# Patient Record
Sex: Female | Born: 1935 | Race: White | Hispanic: No | Marital: Married | Smoking: Former smoker
Health system: Southern US, Community
[De-identification: ages and names within clinical notes are randomized; demographics above are authoritative.]

## PROBLEM LIST (undated history)

## (undated) DIAGNOSIS — I499 Cardiac arrhythmia, unspecified: Secondary | ICD-10-CM

## (undated) DIAGNOSIS — G5602 Carpal tunnel syndrome, left upper limb: Secondary | ICD-10-CM

## (undated) DIAGNOSIS — I4891 Unspecified atrial fibrillation: Secondary | ICD-10-CM

## (undated) DIAGNOSIS — M1990 Unspecified osteoarthritis, unspecified site: Secondary | ICD-10-CM

## (undated) DIAGNOSIS — M810 Age-related osteoporosis without current pathological fracture: Secondary | ICD-10-CM

## (undated) HISTORY — PX: OTHER SURGICAL HISTORY: SHX169

## (undated) HISTORY — DX: Carpal tunnel syndrome, left upper limb: G56.02

## (undated) HISTORY — DX: Age-related osteoporosis without current pathological fracture: M81.0

## (undated) HISTORY — DX: Cardiac arrhythmia, unspecified: I49.9

## (undated) HISTORY — DX: Unspecified atrial fibrillation: I48.91

## (undated) HISTORY — PX: APPENDECTOMY: SHX54

## (undated) HISTORY — PX: MASTECTOMY: SHX3

---

## 1978-10-31 DIAGNOSIS — R87619 Unspecified abnormal cytological findings in specimens from cervix uteri: Secondary | ICD-10-CM | POA: Insufficient documentation

## 1995-11-01 HISTORY — PX: LEEP: SHX91

## 2000-12-21 ENCOUNTER — Other Ambulatory Visit: Admission: RE | Admit: 2000-12-21 | Discharge: 2000-12-21 | Payer: Self-pay | Admitting: Obstetrics and Gynecology

## 2001-03-13 ENCOUNTER — Encounter: Payer: Self-pay | Admitting: Obstetrics and Gynecology

## 2001-03-13 ENCOUNTER — Ambulatory Visit (HOSPITAL_COMMUNITY): Admission: RE | Admit: 2001-03-13 | Discharge: 2001-03-13 | Payer: Self-pay | Admitting: Obstetrics and Gynecology

## 2002-04-23 ENCOUNTER — Ambulatory Visit (HOSPITAL_COMMUNITY): Admission: RE | Admit: 2002-04-23 | Discharge: 2002-04-23 | Payer: Self-pay | Admitting: Obstetrics and Gynecology

## 2002-04-23 ENCOUNTER — Encounter: Payer: Self-pay | Admitting: Obstetrics and Gynecology

## 2003-02-28 ENCOUNTER — Other Ambulatory Visit: Admission: RE | Admit: 2003-02-28 | Discharge: 2003-02-28 | Payer: Self-pay | Admitting: Obstetrics and Gynecology

## 2004-02-01 ENCOUNTER — Other Ambulatory Visit: Payer: Self-pay

## 2004-02-02 ENCOUNTER — Other Ambulatory Visit: Payer: Self-pay

## 2004-04-26 ENCOUNTER — Ambulatory Visit (HOSPITAL_COMMUNITY): Admission: RE | Admit: 2004-04-26 | Discharge: 2004-04-26 | Payer: Self-pay | Admitting: Family Medicine

## 2004-04-26 ENCOUNTER — Other Ambulatory Visit: Admission: RE | Admit: 2004-04-26 | Discharge: 2004-04-26 | Payer: Self-pay | Admitting: Obstetrics and Gynecology

## 2005-12-02 ENCOUNTER — Ambulatory Visit: Payer: Self-pay | Admitting: General Surgery

## 2005-12-02 LAB — HM COLONOSCOPY

## 2006-01-05 ENCOUNTER — Inpatient Hospital Stay: Payer: Self-pay | Admitting: Surgery

## 2006-01-05 ENCOUNTER — Other Ambulatory Visit: Payer: Self-pay

## 2006-04-17 ENCOUNTER — Other Ambulatory Visit: Admission: RE | Admit: 2006-04-17 | Discharge: 2006-04-17 | Payer: Self-pay | Admitting: Obstetrics and Gynecology

## 2006-04-17 ENCOUNTER — Ambulatory Visit (HOSPITAL_COMMUNITY): Admission: RE | Admit: 2006-04-17 | Discharge: 2006-04-17 | Payer: Self-pay | Admitting: Family Medicine

## 2006-05-01 ENCOUNTER — Encounter: Admission: RE | Admit: 2006-05-01 | Discharge: 2006-05-01 | Payer: Self-pay | Admitting: Family Medicine

## 2007-06-06 ENCOUNTER — Encounter: Admission: RE | Admit: 2007-06-06 | Discharge: 2007-06-06 | Payer: Self-pay | Admitting: Family Medicine

## 2008-07-15 ENCOUNTER — Encounter: Admission: RE | Admit: 2008-07-15 | Discharge: 2008-07-15 | Payer: Self-pay | Admitting: Family Medicine

## 2010-10-31 DIAGNOSIS — I4891 Unspecified atrial fibrillation: Secondary | ICD-10-CM

## 2010-10-31 HISTORY — DX: Unspecified atrial fibrillation: I48.91

## 2010-12-27 ENCOUNTER — Other Ambulatory Visit: Payer: Self-pay | Admitting: *Deleted

## 2010-12-27 DIAGNOSIS — Z9010 Acquired absence of unspecified breast and nipple: Secondary | ICD-10-CM

## 2011-01-04 ENCOUNTER — Ambulatory Visit
Admission: RE | Admit: 2011-01-04 | Discharge: 2011-01-04 | Disposition: A | Discharge status: Home / Self Care | Payer: Medicare Other | Source: Ambulatory Visit | Attending: *Deleted | Admitting: *Deleted

## 2011-01-04 DIAGNOSIS — Z9010 Acquired absence of unspecified breast and nipple: Secondary | ICD-10-CM

## 2011-01-14 ENCOUNTER — Other Ambulatory Visit: Payer: Self-pay | Admitting: Family Medicine

## 2011-01-16 ENCOUNTER — Inpatient Hospital Stay: Payer: Self-pay | Admitting: Internal Medicine

## 2011-02-23 ENCOUNTER — Ambulatory Visit: Payer: Self-pay | Admitting: Internal Medicine

## 2011-03-29 ENCOUNTER — Ambulatory Visit: Payer: Self-pay | Admitting: Internal Medicine

## 2011-10-17 ENCOUNTER — Ambulatory Visit: Payer: Self-pay | Admitting: Internal Medicine

## 2011-11-08 DIAGNOSIS — J189 Pneumonia, unspecified organism: Secondary | ICD-10-CM | POA: Diagnosis not present

## 2011-11-08 DIAGNOSIS — R0602 Shortness of breath: Secondary | ICD-10-CM | POA: Diagnosis not present

## 2012-01-20 ENCOUNTER — Other Ambulatory Visit: Payer: Self-pay | Admitting: Family Medicine

## 2012-01-20 DIAGNOSIS — Z9011 Acquired absence of right breast and nipple: Secondary | ICD-10-CM

## 2012-01-20 DIAGNOSIS — Z1231 Encounter for screening mammogram for malignant neoplasm of breast: Secondary | ICD-10-CM

## 2012-02-23 DIAGNOSIS — E7800 Pure hypercholesterolemia, unspecified: Secondary | ICD-10-CM | POA: Diagnosis not present

## 2012-02-23 DIAGNOSIS — R509 Fever, unspecified: Secondary | ICD-10-CM | POA: Diagnosis not present

## 2012-02-23 DIAGNOSIS — C50919 Malignant neoplasm of unspecified site of unspecified female breast: Secondary | ICD-10-CM | POA: Diagnosis not present

## 2012-02-23 DIAGNOSIS — Z1331 Encounter for screening for depression: Secondary | ICD-10-CM | POA: Diagnosis not present

## 2012-02-23 DIAGNOSIS — Z1339 Encounter for screening examination for other mental health and behavioral disorders: Secondary | ICD-10-CM | POA: Diagnosis not present

## 2012-02-23 DIAGNOSIS — R079 Chest pain, unspecified: Secondary | ICD-10-CM | POA: Diagnosis not present

## 2012-02-23 DIAGNOSIS — Z0000 Encounter for general adult medical examination without abnormal findings: Secondary | ICD-10-CM | POA: Diagnosis not present

## 2012-02-23 LAB — LIPID PANEL
Cholesterol: 233 mg/dL — AB (ref 0–200)
HDL: 128 mg/dL — AB (ref 35–70)
LDL Cholesterol: 91 mg/dL
LDl/HDL Ratio: 0.7
Triglycerides: 71 mg/dL (ref 40–160)

## 2012-03-08 ENCOUNTER — Ambulatory Visit
Admission: RE | Admit: 2012-03-08 | Discharge: 2012-03-08 | Disposition: A | Discharge status: Home / Self Care | Payer: Medicare Other | Source: Ambulatory Visit | Attending: Family Medicine | Admitting: Family Medicine

## 2012-03-08 DIAGNOSIS — Z1231 Encounter for screening mammogram for malignant neoplasm of breast: Secondary | ICD-10-CM

## 2012-03-08 DIAGNOSIS — Z9011 Acquired absence of right breast and nipple: Secondary | ICD-10-CM

## 2012-03-13 ENCOUNTER — Other Ambulatory Visit: Payer: Self-pay | Admitting: Family Medicine

## 2012-03-13 DIAGNOSIS — R928 Other abnormal and inconclusive findings on diagnostic imaging of breast: Secondary | ICD-10-CM

## 2012-03-14 DIAGNOSIS — H2510 Age-related nuclear cataract, unspecified eye: Secondary | ICD-10-CM | POA: Diagnosis not present

## 2012-03-22 ENCOUNTER — Other Ambulatory Visit: Payer: Self-pay | Admitting: Family Medicine

## 2012-03-22 ENCOUNTER — Ambulatory Visit
Admission: RE | Admit: 2012-03-22 | Discharge: 2012-03-22 | Disposition: A | Discharge status: Home / Self Care | Payer: Medicare Other | Source: Ambulatory Visit | Attending: Family Medicine | Admitting: Family Medicine

## 2012-03-22 DIAGNOSIS — R928 Other abnormal and inconclusive findings on diagnostic imaging of breast: Secondary | ICD-10-CM

## 2012-12-06 DIAGNOSIS — R197 Diarrhea, unspecified: Secondary | ICD-10-CM | POA: Diagnosis not present

## 2012-12-06 DIAGNOSIS — R198 Other specified symptoms and signs involving the digestive system and abdomen: Secondary | ICD-10-CM | POA: Diagnosis not present

## 2014-02-17 DIAGNOSIS — H2510 Age-related nuclear cataract, unspecified eye: Secondary | ICD-10-CM | POA: Diagnosis not present

## 2014-03-19 DIAGNOSIS — C50919 Malignant neoplasm of unspecified site of unspecified female breast: Secondary | ICD-10-CM | POA: Diagnosis not present

## 2014-03-19 DIAGNOSIS — Z1331 Encounter for screening for depression: Secondary | ICD-10-CM | POA: Diagnosis not present

## 2014-03-19 DIAGNOSIS — Z0000 Encounter for general adult medical examination without abnormal findings: Secondary | ICD-10-CM | POA: Diagnosis not present

## 2014-03-19 DIAGNOSIS — Z1339 Encounter for screening examination for other mental health and behavioral disorders: Secondary | ICD-10-CM | POA: Diagnosis not present

## 2014-03-19 DIAGNOSIS — Z23 Encounter for immunization: Secondary | ICD-10-CM | POA: Diagnosis not present

## 2014-04-04 ENCOUNTER — Ambulatory Visit: Payer: Self-pay | Admitting: Family Medicine

## 2014-04-04 DIAGNOSIS — M5020 Other cervical disc displacement, unspecified cervical region: Secondary | ICD-10-CM | POA: Diagnosis not present

## 2014-04-04 DIAGNOSIS — M5030 Other cervical disc degeneration, unspecified cervical region: Secondary | ICD-10-CM | POA: Diagnosis not present

## 2014-04-04 DIAGNOSIS — M47812 Spondylosis without myelopathy or radiculopathy, cervical region: Secondary | ICD-10-CM | POA: Diagnosis not present

## 2014-04-04 DIAGNOSIS — M542 Cervicalgia: Secondary | ICD-10-CM | POA: Diagnosis not present

## 2014-04-22 DIAGNOSIS — M810 Age-related osteoporosis without current pathological fracture: Secondary | ICD-10-CM | POA: Diagnosis not present

## 2014-06-05 ENCOUNTER — Encounter: Payer: Self-pay | Admitting: *Deleted

## 2014-06-05 DIAGNOSIS — I499 Cardiac arrhythmia, unspecified: Secondary | ICD-10-CM | POA: Insufficient documentation

## 2014-06-05 DIAGNOSIS — I4891 Unspecified atrial fibrillation: Secondary | ICD-10-CM

## 2014-08-14 ENCOUNTER — Other Ambulatory Visit: Payer: Self-pay

## 2014-08-14 DIAGNOSIS — Z1231 Encounter for screening mammogram for malignant neoplasm of breast: Secondary | ICD-10-CM

## 2014-08-14 DIAGNOSIS — Z853 Personal history of malignant neoplasm of breast: Secondary | ICD-10-CM

## 2014-08-14 DIAGNOSIS — Z9011 Acquired absence of right breast and nipple: Secondary | ICD-10-CM

## 2014-08-20 DIAGNOSIS — C50919 Malignant neoplasm of unspecified site of unspecified female breast: Secondary | ICD-10-CM | POA: Diagnosis not present

## 2014-08-20 DIAGNOSIS — M1990 Unspecified osteoarthritis, unspecified site: Secondary | ICD-10-CM | POA: Diagnosis not present

## 2014-08-20 DIAGNOSIS — M810 Age-related osteoporosis without current pathological fracture: Secondary | ICD-10-CM | POA: Diagnosis not present

## 2014-08-20 LAB — TSH: TSH: 4.79 u[IU]/mL (ref 0.41–5.90)

## 2014-08-20 LAB — BASIC METABOLIC PANEL
BUN: 16 mg/dL (ref 4–21)
Creatinine: 0.8 mg/dL (ref 0.5–1.1)
Glucose: 95 mg/dL
Potassium: 4.7 mmol/L (ref 3.4–5.3)
Sodium: 140 mmol/L (ref 137–147)

## 2014-08-20 LAB — CBC AND DIFFERENTIAL
HEMATOCRIT: 45 % (ref 36–46)
HEMOGLOBIN: 15.2 g/dL (ref 12.0–16.0)
Neutrophils Absolute: 3 /uL
PLATELETS: 272 10*3/uL (ref 150–399)
WBC: 5 10*3/mL

## 2014-08-20 LAB — HEPATIC FUNCTION PANEL
ALK PHOS: 98 U/L (ref 25–125)
ALT: 17 U/L (ref 7–35)
AST: 22 U/L (ref 13–35)
BILIRUBIN, TOTAL: 0.5 mg/dL

## 2014-08-29 ENCOUNTER — Ambulatory Visit
Admission: RE | Admit: 2014-08-29 | Discharge: 2014-08-29 | Disposition: A | Discharge status: Home / Self Care | Payer: Medicare Other | Source: Ambulatory Visit

## 2014-08-29 DIAGNOSIS — R636 Underweight: Secondary | ICD-10-CM | POA: Diagnosis not present

## 2014-08-29 DIAGNOSIS — Z9011 Acquired absence of right breast and nipple: Secondary | ICD-10-CM

## 2014-08-29 DIAGNOSIS — Z853 Personal history of malignant neoplasm of breast: Secondary | ICD-10-CM

## 2014-08-29 DIAGNOSIS — Z01419 Encounter for gynecological examination (general) (routine) without abnormal findings: Secondary | ICD-10-CM | POA: Diagnosis not present

## 2014-08-29 DIAGNOSIS — Z1231 Encounter for screening mammogram for malignant neoplasm of breast: Secondary | ICD-10-CM

## 2014-08-29 DIAGNOSIS — Z13820 Encounter for screening for osteoporosis: Secondary | ICD-10-CM | POA: Diagnosis not present

## 2014-12-19 DIAGNOSIS — M8580 Other specified disorders of bone density and structure, unspecified site: Secondary | ICD-10-CM | POA: Diagnosis not present

## 2015-03-31 DIAGNOSIS — E559 Vitamin D deficiency, unspecified: Secondary | ICD-10-CM | POA: Diagnosis not present

## 2015-07-28 ENCOUNTER — Other Ambulatory Visit: Payer: Self-pay

## 2015-07-28 DIAGNOSIS — Z9011 Acquired absence of right breast and nipple: Secondary | ICD-10-CM

## 2015-07-28 DIAGNOSIS — Z1231 Encounter for screening mammogram for malignant neoplasm of breast: Secondary | ICD-10-CM

## 2015-08-20 DIAGNOSIS — E559 Vitamin D deficiency, unspecified: Secondary | ICD-10-CM | POA: Diagnosis not present

## 2015-09-07 ENCOUNTER — Ambulatory Visit
Admission: RE | Admit: 2015-09-07 | Discharge: 2015-09-07 | Disposition: A | Discharge status: Home / Self Care | Payer: Medicare Other | Source: Ambulatory Visit

## 2015-09-07 DIAGNOSIS — Z1231 Encounter for screening mammogram for malignant neoplasm of breast: Secondary | ICD-10-CM

## 2015-09-07 DIAGNOSIS — Z9011 Acquired absence of right breast and nipple: Secondary | ICD-10-CM

## 2015-11-09 DIAGNOSIS — S82832A Other fracture of upper and lower end of left fibula, initial encounter for closed fracture: Secondary | ICD-10-CM | POA: Diagnosis not present

## 2015-11-30 DIAGNOSIS — S82832D Other fracture of upper and lower end of left fibula, subsequent encounter for closed fracture with routine healing: Secondary | ICD-10-CM | POA: Diagnosis not present

## 2015-12-28 DIAGNOSIS — M25572 Pain in left ankle and joints of left foot: Secondary | ICD-10-CM | POA: Diagnosis not present

## 2015-12-31 ENCOUNTER — Encounter: Payer: Self-pay | Admitting: Family Medicine

## 2015-12-31 ENCOUNTER — Ambulatory Visit (INDEPENDENT_AMBULATORY_CARE_PROVIDER_SITE_OTHER): Payer: Medicare Other | Admitting: Family Medicine

## 2015-12-31 VITALS — BP 132/60 | HR 60 | Temp 98.5°F | Resp 10 | Ht 65.75 in | Wt 116.0 lb

## 2015-12-31 DIAGNOSIS — Z0000 Encounter for general adult medical examination without abnormal findings: Secondary | ICD-10-CM | POA: Diagnosis not present

## 2015-12-31 DIAGNOSIS — I499 Cardiac arrhythmia, unspecified: Secondary | ICD-10-CM | POA: Insufficient documentation

## 2015-12-31 DIAGNOSIS — R5383 Other fatigue: Secondary | ICD-10-CM

## 2015-12-31 DIAGNOSIS — Z1211 Encounter for screening for malignant neoplasm of colon: Secondary | ICD-10-CM

## 2015-12-31 DIAGNOSIS — M810 Age-related osteoporosis without current pathological fracture: Secondary | ICD-10-CM | POA: Insufficient documentation

## 2015-12-31 DIAGNOSIS — M159 Polyosteoarthritis, unspecified: Secondary | ICD-10-CM | POA: Diagnosis not present

## 2015-12-31 DIAGNOSIS — I8390 Asymptomatic varicose veins of unspecified lower extremity: Secondary | ICD-10-CM | POA: Insufficient documentation

## 2015-12-31 DIAGNOSIS — R000 Tachycardia, unspecified: Secondary | ICD-10-CM | POA: Insufficient documentation

## 2015-12-31 DIAGNOSIS — M1990 Unspecified osteoarthritis, unspecified site: Secondary | ICD-10-CM | POA: Insufficient documentation

## 2015-12-31 DIAGNOSIS — M8580 Other specified disorders of bone density and structure, unspecified site: Secondary | ICD-10-CM | POA: Insufficient documentation

## 2015-12-31 NOTE — Progress Notes (Signed)
Patient ID: Alisha Dean, female   DOB: [birth year], 82 y.o.   MRN: [id number]  Visit Date: 12/31/2015  Today's Provider: [name], MD   Chief Complaint  Patient presents with  . Medicare Wellness   Subjective:   Alisha Dean is a 82 y.o. female who presents today for her Subsequent Annual Wellness Visit. She feels well. She reports exercising goes to the gym for 1 hour twice a week and works outside. She reports she is sleeping fairly well.  Last Pneumovax 01/19/11  Prevnar 03/19/14  Tdap 04/27/11  Mammogram 09/07/15 BMD 04/22/14 osteopenia Colonoscopy 12/02/05 diverticulosis-repeat 8 to 10 years. Pap smear at Dr. Raphael Gibney in [place] 2016 per patient.  Review of Systems  Constitutional: Negative.   HENT: Negative.   Eyes: Negative.   Respiratory: Negative.   Cardiovascular: Negative.   Gastrointestinal: Negative.   Endocrine: Negative.   Genitourinary: Negative.   Musculoskeletal: Negative.   Skin: Negative.   Allergic/Immunologic: Negative.   Neurological: Negative.   Hematological: Negative.   Psychiatric/Behavioral: Negative.     Patient Active Problem List   Diagnosis Date Noted  . Arrhythmia     Social History   Social History  . Marital Status: Married    Spouse Name: N/A  . Number of Children: N/A  . Years of Education: N/A   Occupational History  . Not on file.   Social History Main Topics  . Smoking status: Former Smoker -- 0.25 packs/day for 11 years    Types: Cigarettes    Quit date: 06/06/1979  . Smokeless tobacco: Never Used  . Alcohol Use: Yes     Comment: 2 to 4 times a month 1 to 2 drinks at those times.  . Drug Use: No  . Sexual Activity: Not on file   Other Topics Concern  . Not on file   Social History Narrative    Past Surgical History  Procedure Laterality Date  . Right masectomy    . Appendectomy      Her family history includes Coronary artery disease in her father; Crohn's disease in her sister;  Dementia in her brother and mother; Diabetes Mellitus I in her father.    Outpatient Prescriptions Prior to Visit  Medication Sig Dispense Refill  . Calcium Carbonate-Vitamin D (CALCIUM + D PO) Take 600 mg by mouth.    Marland Kitchen ibuprofen (ADVIL,MOTRIN) 200 MG tablet Take 200 mg by mouth every 6 (six) hours as needed.    Marland Kitchen albuterol-ipratropium (COMBIVENT) 18-103 MCG/ACT inhaler Inhale into the lungs every 4 (four) hours.    Marland Kitchen alendronate (FOSAMAX) 70 MG tablet Take 70 mg by mouth once a week. Take with a full glass of water on an empty stomach.    . diltiazem (DILACOR XR) 180 MG 24 hr capsule Take 180 mg by mouth daily.    . Glucosamine-Chondroit-Vit C-Mn (GLUCOSAMINE 1500 COMPLEX PO) Take by mouth.    . Melatonin 3 MG CAPS Take by mouth.    . Probiotic Product (PROBIOTIC DAILY PO) Take by mouth.     No facility-administered medications prior to visit.    No Known Allergies  Patient Care Team: Jerrol Banana., MD as PCP - General (Family Medicine)  Objective:   Vitals:  Filed Vitals:   12/31/15 0910  BP: 132/60  Pulse: 60  Temp: 98.5 F (36.9 C)  Resp: 10  Height: 5' 5.75" (1.67 m)  Weight: 116 lb (52.617 kg)    Physical Exam  Constitutional: She is oriented  to person, place, and time. She appears well-developed and well-nourished.  Cachectic white female in no acute distress  HENT:  Head: Normocephalic and atraumatic.  Right Ear: External ear normal.  Left Ear: External ear normal.  Nose: Nose normal.  Mouth/Throat: Oropharynx is clear and moist.  Eyes: Conjunctivae and EOM are normal. Pupils are equal, round, and reactive to light.  Neck: Neck supple.  Cardiovascular: Normal rate, regular rhythm, normal heart sounds and intact distal pulses.   Pulmonary/Chest: Effort normal and breath sounds normal.  Abdominal: Soft.  Neurological: She is alert and oriented to person, place, and time.  Skin: Skin is warm and dry.  Psychiatric: She has a normal mood and affect. Her  behavior is normal. Judgment and thought content normal.    Activities of Daily Living In your present state of health, do you have any difficulty performing the following activities: 12/31/2015  Hearing? N  Vision? N  Difficulty concentrating or making decisions? N  Walking or climbing stairs? N  Dressing or bathing? N  Doing errands, shopping? N    Fall Risk Assessment Fall Risk  12/31/2015  Falls in the past year? Yes  Number falls in past yr: 1  Injury with Fall? Yes  Follow up Falls evaluation completed     Depression Screen  PHQ 2/9 Scores 12/31/2015  PHQ - 2 Score 0    Cognitive Testing - 6-CIT    Year: 0 4 points  Month: 0 3 points  Memorize "Pia Mau, [address], [place]"  Time (within 1 hour:) 0 3 points  Count backwards from 20: 0 2 4 points  Name months of year: 0 2 4 points  Repeat Address: 0 2 4 6 8 10  points   Total Score: 0/28  Interpretation : Normal (0-7) Abnormal (8-28)    Assessment & Plan:     Annual Wellness Visit  Reviewed patient's Family Medical History Reviewed and updated list of patient's medical providers Assessment of cognitive impairment was done Assessed patient's functional ability Established a written schedule for health screening [place] Completed and Reviewed   Pelvic exam and rectal exam through APP at Dr. Frann Rider  Office.  - EKG 12-Lead  2. Colon cancer screening  - CBC with Differential/Platelet - Comprehensive metabolic panel  4. Osteoarthritis of multiple joints, unspecified osteoarthritis type  5. Cardiac arrhythmia, unspecified cardiac arrhythmia type - TSH - EKG 12-Lead  6. Other fatigue - TSH 7. Mild caloric  malnutrition  I have done the exam and reviewed the above chart and it is accurate to the best of my knowledge.  Miguel Aschoff MD [facility] 12/31/2015 9:13  AM  ------------------------------------------------------------------------------------------------------------

## 2016-01-04 DIAGNOSIS — I4891 Unspecified atrial fibrillation: Secondary | ICD-10-CM | POA: Diagnosis not present

## 2016-01-04 DIAGNOSIS — R5383 Other fatigue: Secondary | ICD-10-CM | POA: Diagnosis not present

## 2016-01-04 DIAGNOSIS — I499 Cardiac arrhythmia, unspecified: Secondary | ICD-10-CM | POA: Diagnosis not present

## 2016-01-05 LAB — TSH: TSH: 5.97 u[IU]/mL — AB (ref 0.450–4.500)

## 2016-01-07 ENCOUNTER — Encounter: Payer: Self-pay | Admitting: Family Medicine

## 2016-08-15 ENCOUNTER — Other Ambulatory Visit: Payer: Self-pay | Admitting: Family Medicine

## 2016-08-15 DIAGNOSIS — Z1231 Encounter for screening mammogram for malignant neoplasm of breast: Secondary | ICD-10-CM

## 2016-09-14 ENCOUNTER — Ambulatory Visit
Admission: RE | Admit: 2016-09-14 | Discharge: 2016-09-14 | Disposition: A | Discharge status: Home / Self Care | Payer: Medicare Other | Source: Ambulatory Visit | Attending: Family Medicine | Admitting: Family Medicine

## 2016-09-14 DIAGNOSIS — Z1231 Encounter for screening mammogram for malignant neoplasm of breast: Secondary | ICD-10-CM | POA: Diagnosis not present

## 2017-01-26 ENCOUNTER — Encounter: Payer: Medicare Other | Admitting: Family Medicine

## 2017-01-26 ENCOUNTER — Ambulatory Visit: Payer: Medicare Other

## 2017-02-14 ENCOUNTER — Ambulatory Visit (INDEPENDENT_AMBULATORY_CARE_PROVIDER_SITE_OTHER): Payer: Medicare Other | Admitting: Family Medicine

## 2017-02-14 ENCOUNTER — Encounter: Payer: Self-pay | Admitting: Family Medicine

## 2017-02-14 VITALS — BP 122/64 | HR 62 | Temp 97.5°F | Resp 16 | Ht 66.0 in | Wt 116.0 lb

## 2017-02-14 DIAGNOSIS — E784 Other hyperlipidemia: Secondary | ICD-10-CM

## 2017-02-14 DIAGNOSIS — Z1211 Encounter for screening for malignant neoplasm of colon: Secondary | ICD-10-CM | POA: Diagnosis not present

## 2017-02-14 DIAGNOSIS — Z0000 Encounter for general adult medical examination without abnormal findings: Secondary | ICD-10-CM | POA: Diagnosis not present

## 2017-02-14 DIAGNOSIS — M159 Polyosteoarthritis, unspecified: Secondary | ICD-10-CM | POA: Diagnosis not present

## 2017-02-14 DIAGNOSIS — R5383 Other fatigue: Secondary | ICD-10-CM | POA: Diagnosis not present

## 2017-02-14 DIAGNOSIS — E7849 Other hyperlipidemia: Secondary | ICD-10-CM

## 2017-02-14 NOTE — Progress Notes (Signed)
Patient: Alisha Dean, Female    DOB: February 24, 1933, 83 y.o.   MRN: [id number] Visit Date: 02/14/2017  Today's Provider: [name], MD   Chief Complaint  Patient presents with  . Annual Exam   Subjective:   Alisha Dean is a 83 y.o. female who presents today for her Subsequent Annual Wellness Visit. She feels well. She reports exercising at least 2 days a week, then yard work. She reports she is sleeping well. Pt has 61  Grandchildren ages 81 to 28 yo.  Colonoscopy- 12/02/05 diverticulosis repeat 8-10 years Mammogram- 09/14/16 normal BMD- 04/22/14 osteopenia  Pap- GYN in [place] saw about a year ago.   Mammogram, BMD ordered by GYN  Immunization History  Administered Date(s) Administered  . Influenza-Unspecified 09/01/2015  . Pneumococcal Conjugate-13 03/19/2014  . Pneumococcal Polysaccharide-23 01/19/2011  . Tdap 04/27/2011     Review of Systems  Constitutional: Negative.   HENT: Negative.   Eyes: Positive for itching.  Respiratory: Negative.   Cardiovascular: Negative.   Gastrointestinal: Negative.   Endocrine: Negative.   Genitourinary: Negative.   Musculoskeletal: Negative.   Skin: Negative.   Allergic/Immunologic: Negative.   Neurological: Negative.   Hematological: Negative.   Psychiatric/Behavioral: Negative.     Patient Active Problem List   Diagnosis Date Noted  . Arthritis, degenerative 12/31/2015  . OP (osteoporosis) 12/31/2015  . Fast heart beat 12/31/2015  . Asymptomatic varicose veins 12/31/2015  . Arrhythmia     Social History   Social History  . Marital status: Married    Spouse name: N/A  . Number of children: N/A  . Years of education: N/A   Occupational History  . Not on file.   Social History Main Topics  . Smoking status: Former Smoker    Packs/day: 0.25    Years: 11.00    Types: Cigarettes    Quit date: 06/06/1979  . Smokeless tobacco: Never Used  . Alcohol use Yes     Comment: 2 to 4 times a month 1 to  2 drinks at those times.  . Drug use: No  . Sexual activity: Not on file   Other Topics Concern  . Not on file   Social History Narrative  . No narrative on file    Past Surgical History:  Procedure Laterality Date  . APPENDECTOMY    . LEEP  1997  . right masectomy      Her family history includes Bipolar disorder in her brother; Coronary artery disease in her father; Crohn's disease in her sister; Dementia in her brother and mother; Depression in her mother; Diabetes Mellitus I in her father; Heart disease in her brother; Migraines in her mother; Prostate cancer in her father.     Outpatient Medications Prior to Visit  Medication Sig Dispense Refill  . Calcium Carbonate-Vitamin D (CALCIUM + D PO) Take 600 mg by mouth.    Marland Kitchen ibuprofen (ADVIL,MOTRIN) 200 MG tablet Take 200 mg by mouth every 6 (six) hours as needed.     No facility-administered medications prior to visit.     No Known Allergies  Patient Care Team: Jerrol Banana., MD as PCP - General (Family Medicine)   Objective:   Vitals:  Vitals:   02/14/17 0919  BP: 122/64  Pulse: 62  Resp: 16  Temp: 97.5 F (36.4 C)  TempSrc: Oral  Weight: 116 lb (52.6 kg)  Height: 5\' 6"  (1.676 m)    Physical Exam  Constitutional: She is oriented to person, place, and  time. She appears well-developed and well-nourished.  HENT:  Head: Normocephalic and atraumatic.  Right Ear: External ear normal.  Left Ear: External ear normal.  Nose: Nose normal.  Mouth/Throat: Oropharynx is clear and moist.  Eyes: Conjunctivae and EOM are normal. Pupils are equal, round, and reactive to light.  Neck: Normal range of motion. Neck supple.  Cardiovascular: Normal rate, regular rhythm, normal heart sounds and intact distal pulses.   Pulmonary/Chest: Effort normal and breath sounds normal.  Abdominal: Soft. Bowel sounds are normal.  Musculoskeletal: Normal range of motion.  Neurological: She is alert and oriented to person, place, and  time. She has normal reflexes.  Skin: Skin is warm and dry.  Psychiatric: She has a normal mood and affect. Her behavior is normal. Judgment and thought content normal.    Activities of Daily Living In your present state of health, do you have any difficulty performing the following activities: 02/14/2017  Hearing? N  Vision? N  Difficulty concentrating or making decisions? N  Walking or climbing stairs? N  Dressing or bathing? N  Doing errands, shopping? N  Some recent data might be hidden    Fall Risk Assessment Fall Risk  02/14/2017 12/31/2015  Falls in the past year? No Yes  Number falls in past yr: - 1  Injury with Fall? - Yes  Follow up - Falls evaluation completed     Depression Screen PHQ 2/9 Scores 02/14/2017 12/31/2015  PHQ - 2 Score 0 0  PHQ- 9 Score 1 -    Cognitive Testing - 6-CIT    Year: 0 points  Month: 0 points  Memorize "Pia Mau, [address], [place]"  Time (within 1 hour:) 0 points  Count backwards from 20: 0 points  Name months of year: 0 points  Repeat Address: 0 points   Total Score: 0/28  Interpretation : Normal (0-7) Abnormal (8-28)    Assessment & Plan:     Annual Wellness Visit  Reviewed patient's Family Medical History Reviewed and updated list of patient's medical providers Assessment of cognitive impairment was done Assessed patient's functional ability Established a written schedule for health screening [place] Completed and Reviewed  Exercise Activities and Dietary recommendations Goals    None      Immunization History  Administered Date(s) Administered  . Influenza-Unspecified 09/01/2015  . Pneumococcal Conjugate-13 03/19/2014  . Pneumococcal Polysaccharide-23 01/19/2011  . Tdap 04/27/2011    Health Maintenance  Topic Date Due  . DEXA SCAN  01/19/2000  . INFLUENZA VACCINE  05/31/2017  . TETANUS/TDAP  04/26/2021  . PNA vac Low Risk Adult  Completed   Sees Gyn in [place]. Cologard for  screening. Discussed health benefits of physical activity, and encouraged her to engage in regular exercise appropriate for her age and condition.  h/o Breast Cancer-s/p right mastectomy 1990 OA of Hands Ostopenia I have done the exam and reviewed the above chart and it is accurate to the best of my knowledge. Development worker, community has been used in this note in any air is in the dictation or transcription are unintentional.  [facility] 02/14/2017 9:23 AM  ------------------------------------------------------------------------------------------------------------

## 2017-02-15 LAB — LIPID PANEL WITH LDL/HDL RATIO
CHOLESTEROL TOTAL: 250 mg/dL — AB (ref 100–199)
HDL: 134 mg/dL (ref >39)
LDL Calculated: 102 mg/dL — ABNORMAL HIGH (ref 0–99)
LDl/HDL Ratio: 0.8 ratio (ref 0.0–3.2)
Triglycerides: 72 mg/dL (ref 0–149)
VLDL CHOLESTEROL CAL: 14 mg/dL (ref 5–40)

## 2017-02-15 LAB — CBC WITH DIFFERENTIAL/PLATELET
BASOS ABS: 0 10*3/uL (ref 0.0–0.2)
BASOS: 0 %
EOS (ABSOLUTE): 0.1 10*3/uL (ref 0.0–0.4)
Eos: 2 %
Hematocrit: 43.2 % (ref 34.0–46.6)
Hemoglobin: 14.6 g/dL (ref 11.1–15.9)
IMMATURE GRANS (ABS): 0 10*3/uL (ref 0.0–0.1)
IMMATURE GRANULOCYTES: 0 %
LYMPHS: 30 %
Lymphocytes Absolute: 1.8 10*3/uL (ref 0.7–3.1)
MCH: 31.7 pg (ref 26.6–33.0)
MCHC: 33.8 g/dL (ref 31.5–35.7)
MCV: 94 fL (ref 79–97)
MONOCYTES: 8 %
Monocytes Absolute: 0.5 10*3/uL (ref 0.1–0.9)
NEUTROS PCT: 60 %
Neutrophils Absolute: 3.5 10*3/uL (ref 1.4–7.0)
PLATELETS: 267 10*3/uL (ref 150–379)
RBC: 4.6 x10E6/uL (ref 3.77–5.28)
RDW: 13.9 % (ref 12.3–15.4)
WBC: 5.9 10*3/uL (ref 3.4–10.8)

## 2017-02-15 LAB — COMPREHENSIVE METABOLIC PANEL
A/G RATIO: 2.3 — AB (ref 1.2–2.2)
ALBUMIN: 4.8 g/dL — AB (ref 3.5–4.7)
ALT: 24 IU/L (ref 0–32)
AST: 30 IU/L (ref 0–40)
Alkaline Phosphatase: 88 IU/L (ref 39–117)
BILIRUBIN TOTAL: 0.4 mg/dL (ref 0.0–1.2)
BUN / CREAT RATIO: 29 — AB (ref 12–28)
BUN: 20 mg/dL (ref 8–27)
CO2: 28 mmol/L (ref 18–29)
Calcium: 10.7 mg/dL — ABNORMAL HIGH (ref 8.7–10.3)
Chloride: 96 mmol/L (ref 96–106)
Creatinine, Ser: 0.7 mg/dL (ref 0.57–1.00)
GFR calc non Af Amer: 81 mL/min/{1.73_m2} (ref >59)
GFR, EST AFRICAN AMERICAN: 93 mL/min/{1.73_m2} (ref >59)
Globulin, Total: 2.1 g/dL (ref 1.5–4.5)
Glucose: 94 mg/dL (ref 65–99)
POTASSIUM: 4.8 mmol/L (ref 3.5–5.2)
Sodium: 140 mmol/L (ref 134–144)
TOTAL PROTEIN: 6.9 g/dL (ref 6.0–8.5)

## 2017-02-15 LAB — TSH: TSH: 5.22 u[IU]/mL — ABNORMAL HIGH (ref 0.450–4.500)

## 2017-02-15 NOTE — Progress Notes (Signed)
Advised  ED 

## 2017-02-23 ENCOUNTER — Telehealth: Payer: Self-pay | Admitting: Family Medicine

## 2017-02-23 NOTE — Telephone Encounter (Signed)
Order for cologuard faxed to [facility] °

## 2017-03-15 DIAGNOSIS — Z1212 Encounter for screening for malignant neoplasm of rectum: Secondary | ICD-10-CM | POA: Diagnosis not present

## 2017-03-15 DIAGNOSIS — Z1211 Encounter for screening for malignant neoplasm of colon: Secondary | ICD-10-CM | POA: Diagnosis not present

## 2017-03-16 LAB — COLOGUARD: Cologuard: NEGATIVE

## 2017-03-28 ENCOUNTER — Telehealth: Payer: Self-pay

## 2017-03-28 NOTE — Telephone Encounter (Signed)
Cologuard result negative, LMTCB-aa

## 2017-05-08 ENCOUNTER — Telehealth: Payer: Self-pay | Admitting: Family Medicine

## 2017-05-08 NOTE — Telephone Encounter (Signed)
Lab slip placed up front, pt advised-aa

## 2017-05-08 NOTE — Telephone Encounter (Signed)
Pt is requesting a lb slip to recheck her calcium level. Pt would like to pick this up tomorrow if possible.   CB#[phone]/MW

## 2017-05-10 LAB — PTH, INTACT AND CALCIUM
Calcium: 9.6 mg/dL (ref 8.7–10.3)
PTH: 37 pg/mL (ref 15–65)

## 2017-05-10 LAB — CALCIUM, IONIZED: CALCIUM ION: 5.2 mg/dL (ref 4.5–5.6)

## 2017-12-13 ENCOUNTER — Other Ambulatory Visit: Payer: Self-pay | Admitting: Family Medicine

## 2017-12-13 DIAGNOSIS — Z1231 Encounter for screening mammogram for malignant neoplasm of breast: Secondary | ICD-10-CM

## 2017-12-25 DIAGNOSIS — E559 Vitamin D deficiency, unspecified: Secondary | ICD-10-CM | POA: Insufficient documentation

## 2017-12-25 DIAGNOSIS — I4891 Unspecified atrial fibrillation: Secondary | ICD-10-CM | POA: Diagnosis not present

## 2017-12-25 DIAGNOSIS — C50919 Malignant neoplasm of unspecified site of unspecified female breast: Secondary | ICD-10-CM | POA: Insufficient documentation

## 2017-12-29 ENCOUNTER — Ambulatory Visit
Admission: RE | Admit: 2017-12-29 | Discharge: 2017-12-29 | Disposition: A | Discharge status: Home / Self Care | Payer: Medicare Other | Source: Ambulatory Visit | Attending: Cardiology | Admitting: Cardiology

## 2017-12-29 ENCOUNTER — Encounter: Payer: Self-pay | Admitting: Cardiology

## 2017-12-29 ENCOUNTER — Encounter
Admission: RE | Disposition: A | Discharge status: Home / Self Care | Payer: Self-pay | Source: Ambulatory Visit | Attending: Cardiology

## 2017-12-29 DIAGNOSIS — I4891 Unspecified atrial fibrillation: Secondary | ICD-10-CM | POA: Diagnosis not present

## 2017-12-29 DIAGNOSIS — M1990 Unspecified osteoarthritis, unspecified site: Secondary | ICD-10-CM | POA: Insufficient documentation

## 2017-12-29 DIAGNOSIS — Z8249 Family history of ischemic heart disease and other diseases of the circulatory system: Secondary | ICD-10-CM | POA: Diagnosis not present

## 2017-12-29 DIAGNOSIS — Z7982 Long term (current) use of aspirin: Secondary | ICD-10-CM | POA: Insufficient documentation

## 2017-12-29 HISTORY — PX: LOOP RECORDER INSERTION: EP1214

## 2017-12-29 SURGERY — LOOP RECORDER INSERTION
Anesthesia: Moderate Sedation

## 2017-12-29 MED ORDER — LIDOCAINE-EPINEPHRINE (PF) 1 %-1:200000 IJ SOLN
INTRAMUSCULAR | Status: DC | PRN
  Administered 2017-12-29: 15 mL via INTRADERMAL

## 2017-12-29 MED ORDER — LIDOCAINE-EPINEPHRINE (PF) 1 %-1:200000 IJ SOLN
INTRAMUSCULAR | Status: AC
  Filled 2017-12-29: qty 30

## 2017-12-29 SURGICAL SUPPLY — 2 items
LOOP REVEAL LINQSYS (Prosthesis & Implant Heart) ×2 IMPLANT
PACK LOOP INSERTION (CUSTOM PROCEDURE TRAY) ×2 IMPLANT

## 2017-12-29 NOTE — Discharge Instructions (Signed)
Incision Care, Adult An incision is a surgical cut that is made through your skin. Most incisions are closed after surgery. Your incision may be closed with stitches (sutures), staples, skin glue, or adhesive strips. You may need to return to your health care provider to have sutures or staples removed. This may occur several days to several weeks after your surgery. The incision needs to be cared for properly to prevent infection. How to care for your incision Incision care   Follow instructions from your health care provider about how to take care of your incision. Make sure you: ? Wash your hands with soap and water before you change the bandage (dressing). If soap and water are not available, use hand sanitizer. ? Change your dressing as told by your health care provider. ? Leave sutures, skin glue, or adhesive strips in place. These skin closures may need to stay in place for 2 weeks or longer. If adhesive strip edges start to loosen and curl up, you may trim the loose edges. Do not remove adhesive strips completely unless your health care provider tells you to do that.  Check your incision area every day for signs of infection. Check for: ? More redness, swelling, or pain. ? More fluid or blood. ? Warmth. ? Pus or a bad smell.  Ask your health care provider how to clean the incision. This may include: ? Using mild soap and water. ? Using a clean towel to pat the incision dry after cleaning it. ? Applying a cream or ointment. Do this only as told by your health care provider. ? Covering the incision with a clean dressing.  Ask your health care provider when you can leave the incision uncovered.  Do not take baths, swim, or use a hot tub until your health care provider approves. Ask your health care provider if you can take showers. You may only be allowed to take sponge baths for bathing. Medicines  If you were prescribed an antibiotic medicine, cream, or ointment, take or apply the  antibiotic as told by your health care provider. Do not stop taking or applying the antibiotic even if your condition improves.  Take over-the-counter and prescription medicines only as told by your health care provider. General instructions  Limit movement around your incision to improve healing. ? Avoid straining, lifting, or exercise for the first month, or for as long as told by your health care provider. ? Follow instructions from your health care provider about returning to your normal activities. ? Ask your health care provider what activities are safe.  Protect your incision from the sun when you are outside for the first 6 months, or for as long as told by your health care provider. Apply sunscreen around the scar or cover it up.  Keep all follow-up visits as told by your health care provider. This is important. Contact a health care provider if:  Your have more redness, swelling, or pain around the incision.  You have more fluid or blood coming from the incision.  Your incision feels warm to the touch.  You have pus or a bad smell coming from the incision.  You have a fever or shaking chills.  You are nauseous or you vomit.  You are dizzy.  Your sutures or staples come undone. Get help right away if:  You have a red streak coming from your incision.  Your incision bleeds through the dressing and the bleeding does not stop with gentle pressure.  The edges of   your incision open up and separate.  You have severe pain.  You have a rash.  You are confused.  You faint.  You have trouble breathing and a fast heartbeat. This information is not intended to replace advice given to you by your health care provider. Make sure you discuss any questions you have with your health care provider. Document Released: 05/06/2005 Document Revised: 06/24/2016 Document Reviewed: 05/04/2016 Elsevier Interactive Patient Education  2018 Elsevier Inc[employer]  

## 2018-01-02 ENCOUNTER — Ambulatory Visit: Payer: Medicare Other

## 2018-01-24 DIAGNOSIS — R55 Syncope and collapse: Secondary | ICD-10-CM | POA: Diagnosis not present

## 2018-01-26 DIAGNOSIS — I4891 Unspecified atrial fibrillation: Secondary | ICD-10-CM | POA: Diagnosis not present

## 2018-02-20 ENCOUNTER — Ambulatory Visit
Admission: RE | Admit: 2018-02-20 | Discharge: 2018-02-20 | Disposition: A | Discharge status: Home / Self Care | Payer: Medicare Other | Source: Ambulatory Visit | Attending: Family Medicine | Admitting: Family Medicine

## 2018-02-20 DIAGNOSIS — Z1231 Encounter for screening mammogram for malignant neoplasm of breast: Secondary | ICD-10-CM | POA: Diagnosis not present

## 2018-03-30 DIAGNOSIS — R55 Syncope and collapse: Secondary | ICD-10-CM | POA: Diagnosis not present

## 2018-04-23 ENCOUNTER — Ambulatory Visit: Payer: Medicare Other

## 2018-04-23 ENCOUNTER — Ambulatory Visit
Admission: EM | Admit: 2018-04-23 | Discharge: 2018-04-23 | Disposition: A | Discharge status: Home / Self Care | Payer: Medicare Other | Attending: Emergency Medicine | Admitting: Emergency Medicine

## 2018-04-23 DIAGNOSIS — Z7982 Long term (current) use of aspirin: Secondary | ICD-10-CM | POA: Diagnosis not present

## 2018-04-23 DIAGNOSIS — Z87891 Personal history of nicotine dependence: Secondary | ICD-10-CM | POA: Diagnosis not present

## 2018-04-23 DIAGNOSIS — W2209XA Striking against other stationary object, initial encounter: Secondary | ICD-10-CM | POA: Diagnosis not present

## 2018-04-23 DIAGNOSIS — S9032XA Contusion of left foot, initial encounter: Secondary | ICD-10-CM | POA: Diagnosis not present

## 2018-04-23 DIAGNOSIS — Z79899 Other long term (current) drug therapy: Secondary | ICD-10-CM | POA: Diagnosis not present

## 2018-04-23 DIAGNOSIS — M7989 Other specified soft tissue disorders: Secondary | ICD-10-CM | POA: Diagnosis present

## 2018-04-23 DIAGNOSIS — X58XXXA Exposure to other specified factors, initial encounter: Secondary | ICD-10-CM | POA: Diagnosis not present

## 2018-04-23 DIAGNOSIS — M25572 Pain in left ankle and joints of left foot: Secondary | ICD-10-CM | POA: Diagnosis present

## 2018-04-23 NOTE — ED Provider Notes (Signed)
HPI  SUBJECTIVE:  Alisha Dean is a 84 y.o. female who presents with contusion to the top of her left foot.  Patient states that she accidentally ran into a chair, stubbing her second and third toes against the wooden leg.  Reports pain, swelling, bruising along the top of her foot.  States that she tried ice with significant improvement in her pain.  Symptoms are worse with pressing on the bruise.  She denies numbness tingling of her toes, pain with walking.  She is able to weight-bear without any problem.  Past medical history of tachycardia and osteoporosis. No history of kidney disease, GI bleed, antiplatelet, anticoagulant use, diabetes, hypertension. PMD: Jerrol Banana., MD   Past Medical History:  Diagnosis Date  . Arrhythmia   . Atrial fibrillation ([place])   . Osteoporosis     Past Surgical History:  Procedure Laterality Date  . APPENDECTOMY    . LEEP  1997  . LOOP RECORDER INSERTION N/A 12/29/2017   Procedure: LOOP RECORDER INSERTION;  Surgeon: Isaias Cowman, MD;  Location: [facility];  Service: Cardiovascular;  Laterality: N/A;  . MASTECTOMY Right   . right masectomy      Family History  Problem Relation Age of Onset  . Dementia Mother   . Depression Mother   . Migraines Mother   . Coronary artery disease Father   . Diabetes Mellitus I Father   . Prostate cancer Father   . Crohn's disease Sister   . Dementia Brother   . Heart disease Brother   . Bipolar disorder Brother     Social History   Tobacco Use  . Smoking status: Former Smoker    Packs/day: 0.25    Years: 11.00    Pack years: 2.75    Types: Cigarettes    Last attempt to quit: 06/06/1979    Years since quitting: 38.9  . Smokeless tobacco: Never Used  Substance Use Topics  . Alcohol use: Yes    Comment: 2 to 4 times a month 1 to 2 drinks at those times.  . Drug use: No    No current facility-administered medications for this encounter.   Current Outpatient  Medications:  .  acetaminophen (TYLENOL) 325 MG tablet, Take 650 mg by mouth every 6 (six) hours as needed for moderate pain or headache., Disp: , Rfl:  .  aspirin EC 81 MG tablet, Take by mouth., Disp: , Rfl:  .  diltiazem (CARDIZEM CD) 120 MG 24 hr capsule, Take by mouth., Disp: , Rfl:  .  aspirin EC 81 MG tablet, Take 81 mg by mouth daily., Disp: , Rfl:  .  calcium citrate-vitamin D (CALCIUM + D) 315-200 MG-UNIT tablet, Calcium + D, Disp: , Rfl:   No Known Allergies   ROS  As noted in HPI.   Physical Exam  BP (!) 141/79 (BP Location: Left Arm)   Pulse 60   Temp 98.3 F (36.8 C) (Oral)   Resp 18   SpO2 100%   Constitutional: Well developed, well nourished, no acute distress Eyes:  EOMI, conjunctiva normal bilaterally HENT: Normocephalic, atraumatic,mucus membranes moist Respiratory: Normal inspiratory effort Cardiovascular: Normal rate GI: nondistended skin: No rash, skin intact Musculoskeletal: Tenderness, bruising, swelling along the second and third metatarsals of the left foot.  No tenderness over toes.  Patient able to move toes.  Sensation grossly intact distally.  No ankle tenderness, calcaneal tenderness, midfoot tenderness, base of fifth metatarsal tenderness.  Patient able to bear weight while  in the department.  See Picture.    Neurologic: Alert & oriented x 3, no focal neuro deficits Psychiatric: Speech and behavior appropriate   ED Course   Medications - No data to display  Orders Placed This Encounter  Procedures  . DG Foot Complete Left    Standing Status:   Standing    Number of Occurrences:   1    Order Specific Question:   Reason for Exam (SYMPTOM  OR DIAGNOSIS REQUIRED)    Answer:   injury    No results found for this or any previous visit (from the past 24 hour(s)). Dg Foot Complete Left  Result Date: 04/23/2018 CLINICAL DATA:  Pt stubbed left 3rd toe today on furniture. Pain and bruising T 3rd MTP area EXAM: LEFT FOOT - COMPLETE 3+ VIEW  COMPARISON:  None. FINDINGS: Osseous alignment is normal. No fracture line or displaced fracture fragment seen. 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 Soft tissues about the LEFT foot are unremarkable. IMPRESSION: No acute findings.  No osseous fracture or dislocation. Electronically Signed   By: Franki Cabot M.D.   On: 04/23/2018 20:17    ED Clinical Impression  Contusion of left foot, initial encounter   ED Assessment/Plan  Reviewed imaging independently.  No fracture, dislocation.  See radiology report for full details.  Pt With a left foot contusion.  Ice, elevate, Tylenol/ibuprofen together 3 or 4 times a day as needed for pain.  Follow-up with PMD as needed.  Discussed  imaging, MDM, treatment plan, and plan for follow-up with patient. patient agrees with plan.   No orders of the defined types were placed in this encounter.   *This clinic note was created using Dragon dictation software. Therefore, there may be occasional mistakes despite careful proofreading.   ?   Melynda Ripple, MD 04/23/18 2034

## 2018-04-23 NOTE — ED Triage Notes (Signed)
Pt hit her left foot on a chair leg and does have a bruise on the top of her foot. Wanted to make sure it wasn't broken. States it's not currently painful right now and doesn't hurt to bear weight on it. Does have it elevated, wrapped and iced.

## 2018-05-02 DIAGNOSIS — R000 Tachycardia, unspecified: Secondary | ICD-10-CM | POA: Diagnosis not present

## 2018-05-02 DIAGNOSIS — I4891 Unspecified atrial fibrillation: Secondary | ICD-10-CM | POA: Diagnosis not present

## 2018-05-18 DIAGNOSIS — R55 Syncope and collapse: Secondary | ICD-10-CM | POA: Diagnosis not present

## 2018-06-01 ENCOUNTER — Other Ambulatory Visit: Payer: Self-pay

## 2018-06-14 ENCOUNTER — Ambulatory Visit: Payer: Self-pay

## 2018-06-14 ENCOUNTER — Encounter: Payer: Self-pay | Admitting: Family Medicine

## 2018-07-12 DIAGNOSIS — M542 Cervicalgia: Secondary | ICD-10-CM | POA: Diagnosis not present

## 2018-07-12 DIAGNOSIS — G5600 Carpal tunnel syndrome, unspecified upper limb: Secondary | ICD-10-CM | POA: Diagnosis not present

## 2018-07-18 ENCOUNTER — Ambulatory Visit: Payer: Medicare Other

## 2018-07-18 ENCOUNTER — Encounter: Payer: Medicare Other | Admitting: Family Medicine

## 2018-07-18 DIAGNOSIS — M542 Cervicalgia: Secondary | ICD-10-CM | POA: Insufficient documentation

## 2018-07-23 DIAGNOSIS — R55 Syncope and collapse: Secondary | ICD-10-CM | POA: Diagnosis not present

## 2018-07-24 DIAGNOSIS — M79641 Pain in right hand: Secondary | ICD-10-CM | POA: Diagnosis not present

## 2018-07-24 DIAGNOSIS — R200 Anesthesia of skin: Secondary | ICD-10-CM | POA: Insufficient documentation

## 2018-07-24 DIAGNOSIS — R202 Paresthesia of skin: Secondary | ICD-10-CM | POA: Diagnosis not present

## 2018-08-09 DIAGNOSIS — G5602 Carpal tunnel syndrome, left upper limb: Secondary | ICD-10-CM | POA: Diagnosis not present

## 2018-08-15 DIAGNOSIS — G5602 Carpal tunnel syndrome, left upper limb: Secondary | ICD-10-CM | POA: Diagnosis not present

## 2018-08-20 ENCOUNTER — Ambulatory Visit (INDEPENDENT_AMBULATORY_CARE_PROVIDER_SITE_OTHER): Payer: Medicare Other | Admitting: Family Medicine

## 2018-08-20 ENCOUNTER — Ambulatory Visit (INDEPENDENT_AMBULATORY_CARE_PROVIDER_SITE_OTHER): Payer: Medicare Other

## 2018-08-20 ENCOUNTER — Ambulatory Visit: Payer: Medicare Other

## 2018-08-20 ENCOUNTER — Encounter: Payer: Self-pay | Admitting: Family Medicine

## 2018-08-20 ENCOUNTER — Encounter: Payer: Medicare Other | Admitting: Family Medicine

## 2018-08-20 VITALS — BP 126/72 | HR 55 | Temp 98.5°F | Ht 66.0 in | Wt 113.0 lb

## 2018-08-20 DIAGNOSIS — I499 Cardiac arrhythmia, unspecified: Secondary | ICD-10-CM

## 2018-08-20 DIAGNOSIS — R000 Tachycardia, unspecified: Secondary | ICD-10-CM

## 2018-08-20 DIAGNOSIS — G5602 Carpal tunnel syndrome, left upper limb: Secondary | ICD-10-CM

## 2018-08-20 DIAGNOSIS — M810 Age-related osteoporosis without current pathological fracture: Secondary | ICD-10-CM

## 2018-08-20 DIAGNOSIS — Z0000 Encounter for general adult medical examination without abnormal findings: Secondary | ICD-10-CM | POA: Diagnosis not present

## 2018-08-20 NOTE — Patient Instructions (Signed)
Alisha Dean , Thank you for taking time to come for your Medicare Wellness Visit. I appreciate your ongoing commitment to your health goals. Please review the following plan we discussed and let me know if I can assist you in the future.   Screening recommendations/referrals: Colonoscopy: N/A Mammogram: Up to date Bone Density: Ordered today.  Recommended yearly ophthalmology/optometry visit for glaucoma screening and checkup Recommended yearly dental visit for hygiene and checkup  Vaccinations: Influenza vaccine: Up to date Pneumococcal vaccine: Up to date Tdap vaccine: Up to date Shingles vaccine: Pt declines today.     Advanced directives: Please bring a copy of your POA (Power of Attorney) and/or Living Will to your next appointment.   Conditions/risks identified: Recommend increasing water intake to 6-8 glasses a day.   Next appointment: 2:20 PM today with Dr Rosanna Randy.    Preventive Care 13 Years and Older, Female Preventive care refers to lifestyle choices and visits with your health care provider that can promote health and wellness. What does preventive care include?  A yearly physical exam. This is also called an annual well check.  Dental exams once or twice a year.  Routine eye exams. Ask your health care provider how often you should have your eyes checked.  Personal lifestyle choices, including:  Daily care of your teeth and gums.  Regular physical activity.  Eating a healthy diet.  Avoiding tobacco and drug use.  Limiting alcohol use.  Practicing safe sex.  Taking low-dose aspirin every day.  Taking vitamin and mineral supplements as recommended by your health care provider. What happens during an annual well check? The services and screenings done by your health care provider during your annual well check will depend on your age, overall health, lifestyle risk factors, and family history of disease. Counseling  Your health care provider may ask you  questions about your:  Alcohol use.  Tobacco use.  Drug use.  Emotional well-being.  Home and relationship well-being.  Sexual activity.  Eating habits.  History of falls.  Memory and ability to understand (cognition).  Work and work Statistician.  Reproductive health. Screening  You may have the following tests or measurements:  Height, weight, and BMI.  Blood pressure.  Lipid and cholesterol levels. These may be checked every 5 years, or more frequently if you are over 76 years old.  Skin check.  Lung cancer screening. You may have this screening every year starting at age 70 if you have a 30-pack-year history of smoking and currently smoke or have quit within the past 15 years.  Fecal occult blood test (FOBT) of the stool. You may have this test every year starting at age 43.  Flexible sigmoidoscopy or colonoscopy. You may have a sigmoidoscopy every 5 years or a colonoscopy every 10 years starting at age 51.  Hepatitis C blood test.  Hepatitis B blood test.  Sexually transmitted disease (STD) testing.  Diabetes screening. This is done by checking your blood sugar (glucose) after you have not eaten for a while (fasting). You may have this done every 1-3 years.  Bone density scan. This is done to screen for osteoporosis. You may have this done starting at age 92.  Mammogram. This may be done every 1-2 years. Talk to your health care provider about how often you should have regular mammograms. Talk with your health care provider about your test results, treatment options, and if necessary, the need for more tests. Vaccines  Your health care provider may recommend certain vaccines,  such as:  Influenza vaccine. This is recommended every year.  Tetanus, diphtheria, and acellular pertussis (Tdap, Td) vaccine. You may need a Td booster every 10 years.  Zoster vaccine. You may need this after age 66.  Pneumococcal 13-valent conjugate (PCV13) vaccine. One dose is  recommended after age 28.  Pneumococcal polysaccharide (PPSV23) vaccine. One dose is recommended after age 72. Talk to your health care provider about which screenings and vaccines you need and how often you need them. This information is not intended to replace advice given to you by your health care provider. Make sure you discuss any questions you have with your health care provider. Document Released: 11/13/2015 Document Revised: 07/06/2016 Document Reviewed: 08/18/2015 Elsevier Interactive Patient Education  2017 [place] Prevention in the Home Falls can cause injuries. They can happen to people of all ages. There are many things you can do to make your home safe and to help prevent falls. What can I do on the outside of my home?  Regularly fix the edges of walkways and driveways and fix any cracks.  Remove anything that might make you trip as you walk through a door, such as a raised step or threshold.  Trim any bushes or trees on the path to your home.  Use bright outdoor lighting.  Clear any walking paths of anything that might make someone trip, such as rocks or tools.  Regularly check to see if handrails are loose or broken. Make sure that both sides of any steps have handrails.  Any raised decks and porches should have guardrails on the edges.  Have any leaves, snow, or ice cleared regularly.  Use sand or salt on walking paths during winter.  Clean up any spills in your garage right away. This includes oil or grease spills. What can I do in the bathroom?  Use night lights.  Install grab bars by the toilet and in the tub and shower. Do not use towel bars as grab bars.  Use non-skid mats or decals in the tub or shower.  If you need to sit down in the shower, use a plastic, non-slip stool.  Keep the floor dry. Clean up any water that spills on the floor as soon as it happens.  Remove soap buildup in the tub or shower regularly.  Attach bath mats  securely with double-sided non-slip rug tape.  Do not have throw rugs and other things on the floor that can make you trip. What can I do in the bedroom?  Use night lights.  Make sure that you have a light by your bed that is easy to reach.  Do not use any sheets or blankets that are too big for your bed. They should not hang down onto the floor.  Have a firm chair that has side arms. You can use this for support while you get dressed.  Do not have throw rugs and other things on the floor that can make you trip. What can I do in the kitchen?  Clean up any spills right away.  Avoid walking on wet floors.  Keep items that you use a lot in easy-to-reach places.  If you need to reach something above you, use a strong step stool that has a grab bar.  Keep electrical cords out of the way.  Do not use floor polish or wax that makes floors slippery. If you must use wax, use non-skid floor wax.  Do not have throw rugs and other things on  the floor that can make you trip. What can I do with my stairs?  Do not leave any items on the stairs.  Make sure that there are handrails on both sides of the stairs and use them. Fix handrails that are broken or loose. Make sure that handrails are as long as the stairways.  Check any carpeting to make sure that it is firmly attached to the stairs. Fix any carpet that is loose or worn.  Avoid having throw rugs at the top or bottom of the stairs. If you do have throw rugs, attach them to the floor with carpet tape.  Make sure that you have a light switch at the top of the stairs and the bottom of the stairs. If you do not have them, ask someone to add them for you. What else can I do to help prevent falls?  Wear shoes that:  Do not have high heels.  Have rubber bottoms.  Are comfortable and fit you well.  Are closed at the toe. Do not wear sandals.  If you use a stepladder:  Make sure that it is fully opened. Do not climb a closed  stepladder.  Make sure that both sides of the stepladder are locked into place.  Ask someone to hold it for you, if possible.  Clearly mark and make sure that you can see:  Any grab bars or handrails.  First and last steps.  Where the edge of each step is.  Use tools that help you move around (mobility aids) if they are needed. These include:  Canes.  Walkers.  Scooters.  Crutches.  Turn on the lights when you go into a dark area. Replace any light bulbs as soon as they burn out.  Set up your furniture so you have a clear path. Avoid moving your furniture around.  If any of your floors are uneven, fix them.  If there are any pets around you, be aware of where they are.  Review your medicines with your doctor. Some medicines can make you feel dizzy. This can increase your chance of falling. Ask your doctor what other things that you can do to help prevent falls. This information is not intended to replace advice given to you by your health care provider. Make sure you discuss any questions you have with your health care provider. Document Released: 08/13/2009 Document Revised: 03/24/2016 Document Reviewed: 11/21/2014 Elsevier Interactive Patient Education  2017 [employer].

## 2018-08-20 NOTE — Progress Notes (Addendum)
Alisha Dean  MRN: [id number] DOB: Jan 07, 1933  Subjective:  HPI     The patient is an 84 year old female who presents for follow up of chronic health.  She was last seen on 02/14/17 for her Medical Annual wellness.Also sen earlier today for AWV. She feels well. No complaints.  Patient Active Problem List   Diagnosis Date Noted  . Arthritis, degenerative 12/31/2015  . OP (osteoporosis) 12/31/2015  . Fast heart beat 12/31/2015  . Asymptomatic varicose veins 12/31/2015  . Arrhythmia     Past Medical History:  Diagnosis Date  . Arrhythmia   . Atrial fibrillation ([place])   . Carpal tunnel syndrome on left   . Osteoporosis     Social History   Socioeconomic History  . Marital status: Married    Spouse name: Not on file  . Number of children: 5  . Years of education: Not on file  . Highest education level: Bachelor's degree (e.g., BA, AB, BS)  Occupational History  . Occupation: retired  Scientific laboratory technician  . Financial resource strain: Not hard at all  . Food insecurity:    Worry: Never true    Inability: Never true  . Transportation needs:    Medical: No    Non-medical: No  Tobacco Use  . Smoking status: Former Smoker    Packs/day: 0.25    Years: 11.00    Pack years: 2.75    Types: Cigarettes    Last attempt to quit: 06/06/1979    Years since quitting: 39.2  . Smokeless tobacco: Never Used  Substance and Sexual Activity  . Alcohol use: Yes    Comment: 2 to 4 times a month 1 to 2 drinks at those times.  . Drug use: No  . Sexual activity: Not on file  Lifestyle  . Physical activity:    Days per week: 2 days    Minutes per session: Not on file  . Stress: Not at all  Relationships  . Social connections:    Talks on phone: Patient refused    Gets together: Patient refused    Attends religious service: Patient refused    Active member of club or organization: Patient refused    Attends meetings of clubs or organizations: Patient refused    Relationship status:  Patient refused  . Intimate partner violence:    Fear of current or ex partner: Patient refused    Emotionally abused: Patient refused    Physically abused: Patient refused    Forced sexual activity: Patient refused  Other Topics Concern  . Not on file  Social History Narrative  . Not on file    Outpatient Encounter Medications as of 08/20/2018  Medication Sig  . acetaminophen (TYLENOL) 325 MG tablet Take 650 mg by mouth every 6 (six) hours as needed for moderate pain or headache.  Marland Kitchen aspirin EC 81 MG tablet Take 81 mg by mouth daily.  Marland Kitchen aspirin EC 81 MG tablet Take by mouth.  . calcium citrate-vitamin D (CALCIUM + D) 315-200 MG-UNIT tablet Take 1 tablet by mouth daily.   Marland Kitchen diltiazem (CARDIZEM CD) 120 MG 24 hr capsule Take 120 mg by mouth daily.    No facility-administered encounter medications on file as of 08/20/2018.     No Known Allergies  Review of Systems  Constitutional: Negative.   HENT: Positive for tinnitus.        Runny nose  Eyes: Negative.   Respiratory: Negative.   Cardiovascular: Negative.   Gastrointestinal: Negative.  Genitourinary: Negative.   Musculoskeletal: Positive for joint pain.  Skin: Negative.   Neurological: Negative.        Light-headedness  Endo/Heme/Allergies: Negative.   Psychiatric/Behavioral: Negative.     Objective:  BP 126/72   Pulse (!) 55   Temp 98.5 F (36.9 C) (Oral)   Ht 5\' 6"  (1.676 m)   Wt 113 lb (51.3 kg)   BMI 18.24 kg/m   Physical Exam  Constitutional: She is oriented to person, place, and time and well-developed, well-nourished, and in no distress.  Thin WF NAD.  HENT:  Head: Normocephalic and atraumatic.  Right Ear: External ear normal.  Left Ear: External ear normal.  Nose: Nose normal.  Mouth/Throat: Oropharynx is clear and moist.  Eyes: Conjunctivae are normal. No scleral icterus.  Neck: No thyromegaly present.  Cardiovascular: Normal rate, regular rhythm and normal heart sounds.  Pulmonary/Chest: Effort  normal and breath sounds normal.  Abdominal: Soft.  Musculoskeletal: She exhibits no edema.  Lymphadenopathy:    She has no cervical adenopathy.  Neurological: She is alert and oriented to person, place, and time. Gait normal. GCS score is 15.  Skin: Skin is warm and dry.  Psychiatric: Mood, memory, affect and judgment normal.    Assessment and Plan :  1. Cardiac arrhythmia, unspecified cardiac arrhythmia type Stable  Pt has loop monitor. - TSH  2. Osteoporosis without current pathological fracture, unspecified osteoporosis type BMD. - CBC with Differential/Platelet - Comprehensive metabolic panel - TSH  3. Hypercalcemia Workup if necessary. - CBC with Differential/Platelet - Comprehensive metabolic panel - TSH  4. Tachycardia Stable. - CBC with Differential/Platelet - Comprehensive metabolic panel - TSH 5.Left CTS Per Dr Roland Rack.  I have done the exam and reviewed the chart and it is accurate to the best of my knowledge. Development worker, community has been used and  any errors in dictation or transcription are unintentional. Miguel Aschoff M.D. [facility]

## 2018-08-20 NOTE — Progress Notes (Signed)
Subjective:   Alisha Dean is a 84 y.o. female who presents for Medicare Annual (Subsequent) preventive examination.  Review of Systems:  N/A  Cardiac Risk Factors include: advanced age (>25men, >52 women)     Objective:     Vitals: BP 126/72 (BP Location: Left Arm)   Pulse (!) 55   Temp 98.5 F (36.9 C) (Oral)   Ht 5\' 6"  (1.676 m)   Wt 113 lb (51.3 kg)   BMI 18.24 kg/m   Body mass index is 18.24 kg/m.  Advanced Directives 08/20/2018 12/31/2015  Does Patient Have a Medical Advance Directive? Yes Yes  Type of Paramedic of [place];Living will Living will;Healthcare Power of [place] in Chart? No - copy requested -    Tobacco Social History   Tobacco Use  Smoking Status Former Smoker  . Packs/day: 0.25  . Years: 11.00  . Pack years: 2.75  . Types: Cigarettes  . Last attempt to quit: 06/06/1979  . Years since quitting: 39.2  Smokeless Tobacco Never Used     Counseling given: Not Answered   Clinical Intake:  Pre-visit preparation completed: Yes  Pain : No/denies pain Pain Score: 0-No pain     Nutritional Status: BMI <19  Underweight Nutritional Risks: None Diabetes: No  How often do you need to have someone help you when you read instructions, pamphlets, or other written materials from your doctor or pharmacy?: 1 - Never  Interpreter Needed?: No  Information entered by :: [facility], LPN  Past Medical History:  Diagnosis Date  . Arrhythmia   . Atrial fibrillation ([place])   . Carpal tunnel syndrome on left   . Osteoporosis    Past Surgical History:  Procedure Laterality Date  . APPENDECTOMY    . icm implant    . LEEP  1997  . LOOP RECORDER INSERTION N/A 12/29/2017   Procedure: LOOP RECORDER INSERTION;  Surgeon: Isaias Cowman, MD;  Location: [facility];  Service: Cardiovascular;  Laterality: N/A;  . MASTECTOMY Right   . right masectomy     Family History    Problem Relation Age of Onset  . Dementia Mother   . Depression Mother   . Migraines Mother   . Coronary artery disease Father   . Diabetes Mellitus I Father   . Prostate cancer Father   . Crohn's disease Sister   . Dementia Brother   . Heart disease Brother   . Bipolar disorder Brother    Social History   Socioeconomic History  . Marital status: Married    Spouse name: Not on file  . Number of children: 5  . Years of education: Not on file  . Highest education level: Bachelor's degree (e.g., BA, AB, BS)  Occupational History  . Occupation: retired  Scientific laboratory technician  . Financial resource strain: Not hard at all  . Food insecurity:    Worry: Never true    Inability: Never true  . Transportation needs:    Medical: No    Non-medical: No  Tobacco Use  . Smoking status: Former Smoker    Packs/day: 0.25    Years: 11.00    Pack years: 2.75    Types: Cigarettes    Last attempt to quit: 06/06/1979    Years since quitting: 39.2  . Smokeless tobacco: Never Used  Substance and Sexual Activity  . Alcohol use: Yes    Comment: 2 to 4 times a month 1 to 2  drinks at those times.  . Drug use: No  . Sexual activity: Not on file  Lifestyle  . Physical activity:    Days per week: 2 days    Minutes per session: Not on file  . Stress: Not at all  Relationships  . Social connections:    Talks on phone: Patient refused    Gets together: Patient refused    Attends religious service: Patient refused    Active member of club or organization: Patient refused    Attends meetings of clubs or organizations: Patient refused    Relationship status: Patient refused  Other Topics Concern  . Not on file  Social History Narrative  . Not on file    Outpatient Encounter Medications as of 08/20/2018  Medication Sig  . acetaminophen (TYLENOL) 325 MG tablet Take 650 mg by mouth every 6 (six) hours as needed for moderate pain or headache.  Marland Kitchen aspirin EC 81 MG tablet Take 81 mg by mouth daily.  .  calcium citrate-vitamin D (CALCIUM + D) 315-200 MG-UNIT tablet Take 1 tablet by mouth daily.   Marland Kitchen diltiazem (CARDIZEM CD) 120 MG 24 hr capsule Take 120 mg by mouth daily.   Marland Kitchen aspirin EC 81 MG tablet Take by mouth.   No facility-administered encounter medications on file as of 08/20/2018.     Activities of Daily Living In your present state of health, do you have any difficulty performing the following activities: 08/20/2018  Hearing? N  Vision? N  Difficulty concentrating or making decisions? N  Walking or climbing stairs? N  Dressing or bathing? N  Doing errands, shopping? N  Preparing Food and eating ? N  Using the Toilet? N  In the past six months, have you accidently leaked urine? N  Do you have problems with loss of bowel control? N  Managing your Medications? N  Managing your Finances? N  Housekeeping or managing your Housekeeping? N  Some recent data might be hidden    Patient Care Team: Jerrol Banana., MD as PCP - General (Family Medicine) Ubaldo Glassing Javier Docker, MD as Consulting Physician (Cardiology) Anabel Bene, MD as Referring Physician (Neurology) Poggi, Marshall Cork, MD as Consulting Physician (Surgery)    Assessment:   This is a routine wellness examination for [place].  Exercise Activities and Dietary recommendations Current Exercise Habits: Structured exercise class, Type of exercise: strength training/weights;stretching;Other - see comments(eliptical and bicycle), Time (Minutes): > 60, Frequency (Times/Week): 2, Weekly Exercise (Minutes/Week): 0, Intensity: Moderate, Exercise limited by: None identified  Goals    . DIET - INCREASE WATER INTAKE     Recommend increasing water intake to 6-8 glasses a day.        Fall Risk Fall Risk  08/20/2018 06/01/2018 02/14/2017 12/31/2015  Falls in the past year? No No No Yes  Comment - Emmi Telephone Survey: data to providers prior to load - -  Number falls in past yr: - - - 1  Injury with Fall? - - - Yes  Follow up -  - - Falls evaluation completed   [place]:  Any stairs in or around the home WITH handrails? Yes Home free of loose throw rugs in walkways, pet beds, electrical cords, etc? Yes  Adequate lighting in your home to reduce risk of falls? Yes   ASSISTIVE DEVICES UTILIZED TO PREVENT FALLS:  Life alert? No  Use of a cane, walker or w/c? No  Grab bars in the bathroom? No  Shower chair or bench in shower? Yes  Elevated toilet seat or a handicapped toilet? No    TIMED UP AND GO:  Was the test performed? No .     Depression Screen PHQ 2/9 Scores 08/20/2018 02/14/2017 12/31/2015  PHQ - 2 Score 0 0 0  PHQ- 9 Score - 1 -     Cognitive Function: Declined today.         Immunization History  Administered Date(s) Administered  . Influenza-Unspecified 09/01/2015  . Pneumococcal Conjugate-13 03/19/2014  . Pneumococcal Polysaccharide-23 01/19/2011  . Tdap 04/27/2011    Qualifies for Shingles Vaccine? Yes . Due for Shingrix. Education has been provided regarding the importance of this vaccine. Pt has been advised to call insurance company to determine out of pocket expense. Advised may also receive vaccine at local pharmacy or Health Dept. Verbalized acceptance and understanding.  Tdap: Up to date  Flu Vaccine: Due for Flu vaccine. Does the patient want to receive this vaccine today?  Yes . Will receive at PCP apt.   Pneumococcal Vaccine: Up to date  Screening Tests Health Maintenance  Topic Date Due  . DEXA SCAN  04/22/2016  . TETANUS/TDAP  04/26/2021  . INFLUENZA VACCINE  Completed  . PNA vac Low Risk Adult  Completed    Cancer Screenings:  Colorectal Screening: No longer required.  Mammogram: Up to date  Bone Density: Referral sent today.   Lung Cancer Screening: (Low Dose CT Chest recommended if Age 32-80 years, 30 pack-year currently smoking OR have quit w/in 15years.) does not qualify.    Additional Screening:  Hepatitis C  Screening: N/A  Vision Screening: Recommended annual ophthalmology exams for early detection of glaucoma and other disorders of the eye.  Dental Screening: Recommended annual dental exams for proper oral hygiene  Community Resource Referral:  CRR required this visit?  No       Plan:  I have personally reviewed and addressed the Medicare Annual Wellness questionnaire and have noted the following in the patient's chart:  A. Medical and social history B. Use of alcohol, tobacco or illicit drugs  C. Current medications and supplements D. Functional ability and status E.  Nutritional status F.  Physical activity G. Advance directives H. List of other physicians I.  Hospitalizations, surgeries, and ER visits in previous 12 months J.  [place] such as hearing and vision if needed, cognitive and depression L. Referrals and appointments - none  In addition, I have reviewed and discussed with patient certain preventive protocols, quality metrics, and best practice recommendations. A written personalized care plan for preventive services as well as general preventive health recommendations were provided to patient.  See attached scanned questionnaire for additional information.   Signed,  Fabio Neighbors, LPN Nurse Health Advisor   Nurse Recommendations: None.

## 2018-08-21 LAB — COMPREHENSIVE METABOLIC PANEL
A/G RATIO: 2.2 (ref 1.2–2.2)
ALK PHOS: 93 IU/L (ref 39–117)
ALT: 17 IU/L (ref 0–32)
AST: 24 IU/L (ref 0–40)
Albumin: 4.6 g/dL (ref 3.5–4.7)
BILIRUBIN TOTAL: 0.4 mg/dL (ref 0.0–1.2)
BUN/Creatinine Ratio: 19 (ref 12–28)
BUN: 14 mg/dL (ref 8–27)
CO2: 25 mmol/L (ref 20–29)
Calcium: 10.2 mg/dL (ref 8.7–10.3)
Chloride: 97 mmol/L (ref 96–106)
Creatinine, Ser: 0.74 mg/dL (ref 0.57–1.00)
GFR calc Af Amer: 87 mL/min/{1.73_m2} (ref >59)
GFR calc non Af Amer: 75 mL/min/{1.73_m2} (ref >59)
GLOBULIN, TOTAL: 2.1 g/dL (ref 1.5–4.5)
Glucose: 89 mg/dL (ref 65–99)
POTASSIUM: 4.1 mmol/L (ref 3.5–5.2)
SODIUM: 137 mmol/L (ref 134–144)
Total Protein: 6.7 g/dL (ref 6.0–8.5)

## 2018-08-21 LAB — CBC WITH DIFFERENTIAL/PLATELET
Basophils Absolute: 0 10*3/uL (ref 0.0–0.2)
Basos: 1 %
EOS (ABSOLUTE): 0.1 10*3/uL (ref 0.0–0.4)
EOS: 2 %
HEMATOCRIT: 39.9 % (ref 34.0–46.6)
Hemoglobin: 13.6 g/dL (ref 11.1–15.9)
Immature Grans (Abs): 0 10*3/uL (ref 0.0–0.1)
Immature Granulocytes: 0 %
LYMPHS ABS: 2 10*3/uL (ref 0.7–3.1)
Lymphs: 35 %
MCH: 31.3 pg (ref 26.6–33.0)
MCHC: 34.1 g/dL (ref 31.5–35.7)
MCV: 92 fL (ref 79–97)
MONOS ABS: 0.5 10*3/uL (ref 0.1–0.9)
Monocytes: 8 %
Neutrophils Absolute: 3 10*3/uL (ref 1.4–7.0)
Neutrophils: 54 %
Platelets: 286 10*3/uL (ref 150–450)
RBC: 4.35 x10E6/uL (ref 3.77–5.28)
RDW: 12.5 % (ref 12.3–15.4)
WBC: 5.6 10*3/uL (ref 3.4–10.8)

## 2018-08-21 LAB — TSH: TSH: 4.22 u[IU]/mL (ref 0.450–4.500)

## 2018-08-24 ENCOUNTER — Telehealth: Payer: Self-pay | Admitting: Family Medicine

## 2018-08-24 ENCOUNTER — Telehealth: Payer: Self-pay

## 2018-08-24 NOTE — Telephone Encounter (Signed)
-----   Message from Jerrol Banana., MD sent at 08/24/2018  9:33 AM EDT ----- Normal.

## 2018-08-24 NOTE — Telephone Encounter (Signed)
Pt states that she will schedule her bone density Jan 2020 at [facility] when she schedules her mammogram

## 2018-08-24 NOTE — Telephone Encounter (Signed)
Tried calling patient no answer. Will try again later.

## 2018-08-29 ENCOUNTER — Encounter: Payer: Self-pay | Admitting: *Deleted

## 2018-08-29 ENCOUNTER — Other Ambulatory Visit: Payer: Self-pay

## 2018-08-29 NOTE — Telephone Encounter (Signed)
Pt advised.   Thanks,   -Laura  

## 2018-08-30 ENCOUNTER — Encounter: Payer: Self-pay | Admitting: Family Medicine

## 2018-08-30 DIAGNOSIS — G5603 Carpal tunnel syndrome, bilateral upper limbs: Secondary | ICD-10-CM | POA: Insufficient documentation

## 2018-08-30 DIAGNOSIS — R202 Paresthesia of skin: Secondary | ICD-10-CM | POA: Diagnosis not present

## 2018-08-30 DIAGNOSIS — M542 Cervicalgia: Secondary | ICD-10-CM | POA: Diagnosis not present

## 2018-08-30 DIAGNOSIS — R200 Anesthesia of skin: Secondary | ICD-10-CM | POA: Diagnosis not present

## 2018-09-03 NOTE — Anesthesia Preprocedure Evaluation (Addendum)
Anesthesia Evaluation  Patient identified by MRN, date of birth, ID band Patient awake    Reviewed: Allergy & Precautions, NPO status , Patient's Chart, lab work & pertinent test results  History of Anesthesia Complications Negative for: history of anesthetic complications  Airway Mallampati: I  TM Distance: >3 FB Neck ROM: Full    Dental no notable dental hx.    Pulmonary former smoker (quit 1969),    Pulmonary exam normal breath sounds clear to auscultation       Cardiovascular Exercise Tolerance: Good Normal cardiovascular exam+ dysrhythmias (a fib on Diltiazem)  Rhythm:Regular Rate:Normal  ECG 12/25/17:  Sinus bradycardia (HR 57) with premature atrial complexes Low voltage QRS   Neuro/Psych  Neuromuscular disease (carpal tunnel syndrome)    GI/Hepatic negative GI ROS,   Endo/Other  negative endocrine ROS  Renal/GU negative Renal ROS     Musculoskeletal  (+) Arthritis , Osteoarthritis,    Abdominal   Peds  Hematology Breast CA s/p right mastectomy and LN dissection   Anesthesia Other Findings PCP note 08/20/18:  1. Cardiac arrhythmia, unspecified cardiac arrhythmia type Stable  Pt has loop monitor. - TSH  2. Osteoporosis without current pathological fracture, unspecified osteoporosis type BMD. - CBC with Differential/Platelet - Comprehensive metabolic panel - TSH  3. Hypercalcemia Workup if necessary. - CBC with Differential/Platelet - Comprehensive metabolic panel - TSH  4. Tachycardia Stable. - CBC with Differential/Platelet - Comprehensive metabolic panel - TSH  5.Left CTS Per Dr Roland Rack.  I have done the exam and reviewed the chart and it is accurate to the best of my knowledge. Development worker, community has been used and  any errors in dictation or transcription are unintentional. Miguel Aschoff M.D. [facility]  Reproductive/Obstetrics                             Anesthesia Physical Anesthesia Plan  ASA: III  Anesthesia Plan: General and Bier Block and Bier Block-LIDOCAINE ONLY   Post-op Pain Management:  Regional for Post-op pain and GA combined w/ Regional for post-op pain   Induction: Intravenous  PONV Risk Score and Plan: 3 and Propofol infusion and TIVA  Airway Management Planned: Natural Airway  Additional Equipment:   Intra-op Plan:   Post-operative Plan:   Informed Consent: I have reviewed the patients History and Physical, chart, labs and discussed the procedure including the risks, benefits and alternatives for the proposed anesthesia with the patient or authorized representative who has indicated his/her understanding and acceptance.     Plan Discussed with: CRNA  Anesthesia Plan Comments:        Anesthesia Quick Evaluation

## 2018-09-05 ENCOUNTER — Ambulatory Visit
Admission: RE | Admit: 2018-09-05 | Discharge: 2018-09-05 | Disposition: A | Discharge status: Home / Self Care | Payer: Medicare Other | Source: Ambulatory Visit | Attending: Surgery | Admitting: Surgery

## 2018-09-05 ENCOUNTER — Ambulatory Visit: Payer: Medicare Other | Admitting: Anesthesiology

## 2018-09-05 ENCOUNTER — Encounter
Admission: RE | Disposition: A | Discharge status: Home / Self Care | Payer: Self-pay | Source: Ambulatory Visit | Attending: Surgery

## 2018-09-05 DIAGNOSIS — M810 Age-related osteoporosis without current pathological fracture: Secondary | ICD-10-CM | POA: Insufficient documentation

## 2018-09-05 DIAGNOSIS — G5602 Carpal tunnel syndrome, left upper limb: Secondary | ICD-10-CM | POA: Insufficient documentation

## 2018-09-05 DIAGNOSIS — M1990 Unspecified osteoarthritis, unspecified site: Secondary | ICD-10-CM | POA: Insufficient documentation

## 2018-09-05 DIAGNOSIS — Z87891 Personal history of nicotine dependence: Secondary | ICD-10-CM | POA: Diagnosis not present

## 2018-09-05 HISTORY — DX: Unspecified osteoarthritis, unspecified site: M19.90

## 2018-09-05 HISTORY — PX: CARPAL TUNNEL RELEASE: SHX101

## 2018-09-05 HISTORY — DX: Carpal tunnel syndrome, left upper limb: G56.02

## 2018-09-05 SURGERY — RELEASE, CARPAL TUNNEL, ENDOSCOPIC
Anesthesia: Regional | Site: Wrist | Laterality: Left

## 2018-09-05 MED ORDER — FENTANYL CITRATE (PF) 100 MCG/2ML IJ SOLN: 25.0000 ug | INTRAMUSCULAR | Status: DC | PRN

## 2018-09-05 MED ORDER — BUPIVACAINE HCL (PF) 0.5 % IJ SOLN
INTRAMUSCULAR | Status: DC | PRN
  Administered 2018-09-05: 10 mL

## 2018-09-05 MED ORDER — PROPOFOL 500 MG/50ML IV EMUL
INTRAVENOUS | Status: DC | PRN
  Administered 2018-09-05: 75 ug/kg/min via INTRAVENOUS

## 2018-09-05 MED ORDER — ONDANSETRON HCL 4 MG/2ML IJ SOLN: 4.0000 mg | Freq: Four times a day (QID) | INTRAMUSCULAR | Status: DC | PRN

## 2018-09-05 MED ORDER — DEXMEDETOMIDINE HCL 200 MCG/2ML IV SOLN
INTRAVENOUS | Status: DC | PRN
  Administered 2018-09-05 (×2): 4 ug via INTRAVENOUS

## 2018-09-05 MED ORDER — CEFAZOLIN SODIUM 1 G IJ SOLR
2000.0000 mg | Freq: Once | INTRAMUSCULAR | Status: AC
  Administered 2018-09-05: 2000 mg via INTRAVENOUS

## 2018-09-05 MED ORDER — FENTANYL CITRATE (PF) 100 MCG/2ML IJ SOLN
INTRAMUSCULAR | Status: DC | PRN
  Administered 2018-09-05: 50 ug via INTRAVENOUS

## 2018-09-05 MED ORDER — METOCLOPRAMIDE HCL 5 MG PO TABS: 5.0000 mg | ORAL_TABLET | Freq: Three times a day (TID) | ORAL | Status: DC | PRN

## 2018-09-05 MED ORDER — ONDANSETRON HCL 4 MG PO TABS: 4.0000 mg | ORAL_TABLET | Freq: Four times a day (QID) | ORAL | Status: DC | PRN

## 2018-09-05 MED ORDER — OXYCODONE HCL 5 MG/5ML PO SOLN: 5.0000 mg | Freq: Once | ORAL | Status: DC | PRN

## 2018-09-05 MED ORDER — LIDOCAINE HCL (PF) 0.5 % IJ SOLN
INTRAMUSCULAR | Status: DC | PRN
  Administered 2018-09-05: 30 mL via INTRAVENOUS

## 2018-09-05 MED ORDER — TRAMADOL HCL 50 MG PO TABS: 50.0000 mg | ORAL_TABLET | Freq: Four times a day (QID) | ORAL | 0 refills | Status: AC | PRN

## 2018-09-05 MED ORDER — OXYCODONE HCL 5 MG PO TABS: 5.0000 mg | ORAL_TABLET | Freq: Once | ORAL | Status: DC | PRN

## 2018-09-05 MED ORDER — LACTATED RINGERS IV SOLN
INTRAVENOUS | Status: DC
  Administered 2018-09-05: 13:00:00 via INTRAVENOUS

## 2018-09-05 MED ORDER — MIDAZOLAM HCL 5 MG/5ML IJ SOLN
INTRAMUSCULAR | Status: DC | PRN
  Administered 2018-09-05 (×2): 1 mg via INTRAVENOUS

## 2018-09-05 MED ORDER — ACETAMINOPHEN 10 MG/ML IV SOLN: 1000.0000 mg | Freq: Once | INTRAVENOUS | Status: DC | PRN

## 2018-09-05 MED ORDER — POTASSIUM CHLORIDE IN NACL 20-0.9 MEQ/L-% IV SOLN: INTRAVENOUS | Status: DC

## 2018-09-05 MED ORDER — METOCLOPRAMIDE HCL 5 MG/ML IJ SOLN: 5.0000 mg | Freq: Three times a day (TID) | INTRAMUSCULAR | Status: DC | PRN

## 2018-09-05 MED ORDER — ONDANSETRON HCL 4 MG/2ML IJ SOLN: 4.0000 mg | Freq: Once | INTRAMUSCULAR | Status: DC | PRN

## 2018-09-05 MED ORDER — LACTATED RINGERS IV SOLN: INTRAVENOUS | Status: DC

## 2018-09-05 SURGICAL SUPPLY — 25 items
BANDAGE ELASTIC 2 LF NS (GAUZE/BANDAGES/DRESSINGS) ×2 IMPLANT
BNDG COHESIVE 4X5 TAN STRL (GAUZE/BANDAGES/DRESSINGS) ×2 IMPLANT
BNDG ESMARK 4X12 TAN STRL LF (GAUZE/BANDAGES/DRESSINGS) ×2 IMPLANT
CHLORAPREP W/TINT 26ML (MISCELLANEOUS) ×2 IMPLANT
CORD BIP STRL DISP 12FT (MISCELLANEOUS) ×2 IMPLANT
COVER LIGHT HANDLE UNIVERSAL (MISCELLANEOUS) ×4 IMPLANT
CUFF TOURNIQUET DUAL PORT 18X3 (MISCELLANEOUS) ×2 IMPLANT
DRAPE SURG 17X11 SM STRL (DRAPES) ×2 IMPLANT
GAUZE PETRO XEROFOAM 1X8 (MISCELLANEOUS) ×2 IMPLANT
GAUZE SPONGE 4X4 12PLY STRL (GAUZE/BANDAGES/DRESSINGS) ×2 IMPLANT
GLOVE BIO SURGEON STRL SZ8 (GLOVE) ×2 IMPLANT
GLOVE INDICATOR 8.0 STRL GRN (GLOVE) ×2 IMPLANT
GOWN STRL REUS W/ TWL LRG LVL3 (GOWN DISPOSABLE) ×1 IMPLANT
GOWN STRL REUS W/ TWL XL LVL3 (GOWN DISPOSABLE) ×1 IMPLANT
GOWN STRL REUS W/TWL LRG LVL3 (GOWN DISPOSABLE) ×1
GOWN STRL REUS W/TWL XL LVL3 (GOWN DISPOSABLE) ×1
KIT CARPAL TUNNEL (MISCELLANEOUS) ×1
KIT ESCP INSRT D SLOT CANN KN (MISCELLANEOUS) ×1 IMPLANT
KIT TURNOVER KIT A (KITS) ×2 IMPLANT
NS IRRIG 500ML POUR BTL (IV SOLUTION) ×2 IMPLANT
PACK EXTREMITY ARMC (MISCELLANEOUS) ×2 IMPLANT
SPLINT WRIST M LT TX990308 (SOFTGOODS) ×2 IMPLANT
STOCKINETTE IMPERVIOUS 9X36 MD (GAUZE/BANDAGES/DRESSINGS) ×2 IMPLANT
STRAP BODY AND KNEE 60X3 (MISCELLANEOUS) ×2 IMPLANT
SUT PROLENE 4 0 PS 2 18 (SUTURE) ×2 IMPLANT

## 2018-09-05 NOTE — Op Note (Signed)
09/05/2018  3:45 PM  Patient:   Alisha Dean  Pre-Op Diagnosis:   Left carpal tunnel syndrome.  Post-Op Diagnosis:   Same.  Procedure:   Endoscopic left carpal tunnel release.  Surgeon:   Pascal Lux, MD  Assistant:   Phoebe Sharps, PA-S  Anesthesia:   Bier block  Findings:   As above.  Complications:   None  EBL:   0 cc  Fluids:   500 cc crystalloid  TT:   25 minutes at 250 mmHg  Drains:   None  Closure:   4-0 Prolene interrupted sutures  Brief Clinical Note:   The patient is an 84 year old female with a 1 year history of gradually worsening pain and paresthesias to her left hand.  Her symptoms have persisted despite medications, activity modification, splinting, etc.  Her history and examination are consistent with carpal tunnel syndrome confirmed by EMG.  The patient presents at this time for an endoscopic left carpal tunnel release.   Procedure:   The patient was brought into the operating room and lain in the supine position. After adequate IV sedation was achieved, a timeout was performed to verify the appropriate surgical site before a Bier block was placed by the anesthesiologist and the tourniquet inflated to 250 mmHg. The left hand and upper extremity were prepped with ChloraPrep solution before being draped sterilely. Preoperative antibiotics were administered. An approximately 1.5-2 cm incision was made over the volar wrist flexion crease, centered over the palmaris longus tendon. The incision was carried down through the subcutaneous tissues with care taken to identify and protect any neurovascular structures. The distal forearm fascia was penetrated just proximal to the transverse carpal ligament. The soft tissues were released off the superficial and deep surfaces of the distal forearm fascia and this was released proximally for 3-4 cm under direct visualization.  Attention was directed distally. The Soil scientist was passed beneath the transverse  carpal ligament along the ulnar aspect of the carpal tunnel and used to release any adhesions as well as to remove any adherent synovial tissue before first the smaller then the larger of the two dilators were passed beneath the transverse carpal ligament along the ulnar margin of the carpal tunnel. The slotted cannula was introduced and the endoscope was placed into the slotted cannula and the undersurface of the transverse carpal ligament visualized. The distal margin of the transverse carpal ligament was marked by placing a 25-gauge needle percutaneously at [place] cardinal point so that it entered the distal portion of the slotted cannula. Under endoscopic visualization, the transverse carpal ligament was released from proximal to distal using the end-cutting blade. A second pass was performed to ensure complete release of the ligament. The adequacy of release was verified both endoscopically and by palpation using the freer elevator.  The wound was irrigated thoroughly with sterile saline solution before being closed using 4-0 Prolene interrupted sutures. A total of 10 cc of 0.5% plain Sensorcaine was injected in and around the incision before a sterile bulky dressing was applied to the wound. The patient was placed into a volar wrist splint before being awakened and returned to the recovery room in satisfactory condition after tolerating the procedure well.

## 2018-09-05 NOTE — Anesthesia Procedure Notes (Addendum)
Anesthesia Regional Block: Bier block (IV Regional)   Pre-Anesthetic Checklist: ,, timeout performed, Correct Patient, Correct Site, Correct Laterality, Correct Procedure, Correct Position, site marked, Risks and benefits discussed,  Surgical consent,  Pre-op evaluation,  At surgeon's request and post-op pain management  Laterality: Left      Procedures:,,,,, intact distal pulses, Esmarch exsanguination,, #20gu IV placed and double tourniquet utilized  Narrative:  Start time: 09/05/2018 3:19 PM End time: 09/05/2018 3:20 PM  Additional Notes: Dr Erenest Rasher performed block. Esmarch exsanguination with double tourniquet utilized. Both cuffs tested and distal cuff deflated. Negative radial pulse noted. 30mL 0.5% plain Lidocaine injected by Dr. Erenest Rasher. Pt tolerated procedure well. VSS.

## 2018-09-05 NOTE — Anesthesia Procedure Notes (Signed)
Procedure Name: Parker Performed by: Janna Arch, CRNA Pre-anesthesia Checklist: Patient identified, Emergency Drugs available, Suction available, Patient being monitored and Timeout performed Patient Re-evaluated:Patient Re-evaluated prior to induction Oxygen Delivery Method: Simple face mask Placement Confirmation: positive ETCO2 and breath sounds checked- equal and bilateral

## 2018-09-05 NOTE — Discharge Instructions (Signed)
General Anesthesia, Adult, Care After °These instructions provide you with information about caring for yourself after your procedure. Your health care provider may also give you more specific instructions. Your treatment has been planned according to current medical practices, but problems sometimes occur. Call your health care provider if you have any problems or questions after your procedure. °What can I expect after the procedure? °After the procedure, it is common to have: °· Vomiting. °· A sore throat. °· Mental slowness. ° °It is common to feel: °· Nauseous. °· Cold or shivery. °· Sleepy. °· Tired. °· Sore or achy, even in parts of your body where you did not have surgery. ° °Follow these instructions at home: °For at least 24 hours after the procedure: °· Do not: °? Participate in activities where you could fall or become injured. °? Drive. °? Use heavy machinery. °? Drink alcohol. °? Take sleeping pills or medicines that cause drowsiness. °? Make important decisions or sign legal documents. °? Take care of children on your own. °· Rest. °Eating and drinking °· If you vomit, drink water, juice, or soup when you can drink without vomiting. °· Drink enough fluid to keep your urine clear or pale yellow. °· Make sure you have little or no nausea before eating solid foods. °· Follow the diet recommended by your health care provider. °General instructions °· Have a responsible adult stay with you until you are awake and alert. °· Return to your normal activities as told by your health care provider. Ask your health care provider what activities are safe for you. °· Take over-the-counter and prescription medicines only as told by your health care provider. °· If you smoke, do not smoke without supervision. °· Keep all follow-up visits as told by your health care provider. This is important. °Contact a health care provider if: °· You continue to have nausea or vomiting at home, and medicines are not helpful. °· You  cannot drink fluids or start eating again. °· You cannot urinate after 8-12 hours. °· You develop a skin rash. °· You have fever. °· You have increasing redness at the site of your procedure. °Get help right away if: °· You have difficulty breathing. °· You have chest pain. °· You have unexpected bleeding. °· You feel that you are having a life-threatening or urgent problem. °This information is not intended to replace advice given to you by your health care provider. Make sure you discuss any questions you have with your health care provider. °Document Released: 01/23/2001 Document Revised: 03/21/2016 Document Reviewed: 10/01/2015 °Elsevier Interactive Patient Education © 2018 [employer] ° ° °Orthopedic discharge instructions: °Keep dressing dry and intact. °Keep hand elevated above heart level. °May shower after dressing removed on postop day 4 (Sunday). °Cover sutures with Band-Aids after drying off. °Apply ice to affected area frequently. °Take ibuprofen 600 mg TID with meals for 7-10 days, then as necessary. °Take ES Tylenol or pain medication as prescribed when needed.  °Return for follow-up in 10-14 days or as scheduled. °

## 2018-09-05 NOTE — Transfer of Care (Signed)
Immediate Anesthesia Transfer of Care Note  Patient: Alisha Dean  Procedure(s) Performed: CARPAL TUNNEL RELEASE ENDOSCOPIC (Left Wrist)  Patient Location: PACU  Anesthesia Type: General, Bier Block  Level of Consciousness: awake, alert  and patient cooperative  Airway and Oxygen Therapy: Patient Spontanous Breathing and Patient connected to supplemental oxygen  Post-op Assessment: Post-op Vital signs reviewed, Patient's Cardiovascular Status Stable, Respiratory Function Stable, Patent Airway and No signs of Nausea or vomiting  Post-op Vital Signs: Reviewed and stable  Complications: No apparent anesthesia complications

## 2018-09-05 NOTE — Anesthesia Postprocedure Evaluation (Signed)
Anesthesia Post Note  Patient: Alisha Dean  Procedure(s) Performed: CARPAL TUNNEL RELEASE ENDOSCOPIC (Left Wrist)  Patient location during evaluation: PACU Anesthesia Type: Bier Block Level of consciousness: awake and alert, oriented and patient cooperative Pain management: pain level controlled Vital Signs Assessment: post-procedure vital signs reviewed and stable Respiratory status: spontaneous breathing, nonlabored ventilation and respiratory function stable Cardiovascular status: blood pressure returned to baseline and stable Postop Assessment: adequate PO intake Anesthetic complications: no    Darrin Nipper

## 2018-09-05 NOTE — H&P (Signed)
Paper H&P to be scanned into permanent record. H&P reviewed and patient re-examined. No changes. 

## 2018-09-06 ENCOUNTER — Encounter: Payer: Self-pay | Admitting: Surgery

## 2018-09-11 DIAGNOSIS — R55 Syncope and collapse: Secondary | ICD-10-CM | POA: Diagnosis not present

## 2018-10-29 DIAGNOSIS — I4891 Unspecified atrial fibrillation: Secondary | ICD-10-CM | POA: Diagnosis not present

## 2019-01-15 ENCOUNTER — Other Ambulatory Visit: Payer: Self-pay | Admitting: Family Medicine

## 2019-01-15 DIAGNOSIS — Z1231 Encounter for screening mammogram for malignant neoplasm of breast: Secondary | ICD-10-CM

## 2019-01-15 DIAGNOSIS — M810 Age-related osteoporosis without current pathological fracture: Secondary | ICD-10-CM

## 2019-02-05 DIAGNOSIS — R55 Syncope and collapse: Secondary | ICD-10-CM | POA: Diagnosis not present

## 2019-03-20 DIAGNOSIS — R55 Syncope and collapse: Secondary | ICD-10-CM | POA: Diagnosis not present

## 2019-03-21 ENCOUNTER — Ambulatory Visit: Payer: Medicare Other

## 2019-03-21 ENCOUNTER — Other Ambulatory Visit: Payer: Medicare Other

## 2019-04-22 DIAGNOSIS — I4891 Unspecified atrial fibrillation: Secondary | ICD-10-CM | POA: Diagnosis not present

## 2019-05-13 ENCOUNTER — Other Ambulatory Visit: Payer: Self-pay

## 2019-05-13 ENCOUNTER — Ambulatory Visit
Admission: RE | Admit: 2019-05-13 | Discharge: 2019-05-13 | Disposition: A | Discharge status: Home / Self Care | Payer: Medicare Other | Source: Ambulatory Visit | Attending: Family Medicine | Admitting: Family Medicine

## 2019-05-13 ENCOUNTER — Other Ambulatory Visit: Payer: Medicare Other

## 2019-05-13 DIAGNOSIS — M810 Age-related osteoporosis without current pathological fracture: Secondary | ICD-10-CM | POA: Diagnosis not present

## 2019-05-13 DIAGNOSIS — Z1231 Encounter for screening mammogram for malignant neoplasm of breast: Secondary | ICD-10-CM

## 2019-05-13 DIAGNOSIS — Z780 Asymptomatic menopausal state: Secondary | ICD-10-CM | POA: Diagnosis not present

## 2019-08-23 NOTE — Progress Notes (Signed)
Patient: Alisha Dean Female    DOB: [birth year]   85 y.o.   MRN: [id number] Visit Date: 08/27/2019  Today's Provider: [name], MD   Chief Complaint  Patient presents with  . F/U to [facility]   Subjective:    Patient saw [facility] for AWV today at 9:00 am.  HPI  Pt is married,retired,exercises 3 times per week., Cardiac arrhythmia, unspecified cardiac arrhythmia type From 08/20/2018-Stable  Pt has loop monitor. Labs normal.  BP Readings from Last 3 Encounters:  08/27/19 132/82  08/27/19 132/82  09/05/18 (!) 154/88    Osteoporosis without current pathological fracture, unspecified osteoporosis type From 08/20/2018-BMD ordered. Labs normal.   Hypercalcemia From 08/20/2018-Workup if necessary. Labs normal.   Left CTS From 08/20/2018-Per Dr Roland Rack.  No Known Allergies   Current Outpatient Medications:  .  acetaminophen (TYLENOL) 325 MG tablet, Take 650 mg by mouth every 6 (six) hours as needed for moderate pain or headache., Disp: , Rfl:  .  aspirin EC 81 MG tablet, Take 81 mg by mouth daily. , Disp: , Rfl:  .  calcium citrate-vitamin D (CALCIUM + D) 315-200 MG-UNIT tablet, Take 1 tablet by mouth daily. , Disp: , Rfl:  .  diltiazem (CARDIZEM CD) 120 MG 24 hr capsule, Take 120 mg by mouth daily. , Disp: , Rfl:  .  Polyethylene Glycol 3350 (PEG 3350) 17 GM/SCOOP POWD, Take by mouth daily. , Disp: , Rfl:  .  traMADol (ULTRAM) 50 MG tablet, Take 1 tablet (50 mg total) by mouth every 6 (six) hours as needed. (Patient not taking: Reported on 08/27/2019), Disp: 10 tablet, Rfl: 0  Review of Systems  Constitutional: Negative.   HENT: Negative.   Eyes: Negative.   Respiratory: Negative.   Cardiovascular: Negative.   Gastrointestinal: Negative.   Endocrine: Negative.   Genitourinary: Negative.   Musculoskeletal: Positive for arthralgias and joint swelling.  Skin: Negative.   Allergic/Immunologic: Negative.   Neurological: Negative.   Hematological: Negative.    Psychiatric/Behavioral: Negative.     Social History   Tobacco Use  . Smoking status: Former Smoker    Packs/day: 0.25    Years: 11.00    Pack years: 2.75    Types: Cigarettes    Quit date: 1969    Years since quitting: 51.8  . Smokeless tobacco: Never Used  Substance Use Topics  . Alcohol use: Yes    Alcohol/week: 6.0 standard drinks    Types: 6 Glasses of wine per week    Comment:     Depression screen [facility] 2/9 08/27/2019 08/20/2018 02/14/2017  Decreased Interest 0 0 0  Down, Depressed, Hopeless 0 0 0  PHQ - 2 Score 0 0 0  Altered sleeping - - 1  Tired, decreased energy - - 0  Change in appetite - - 0  Feeling bad or failure about yourself  - - 0  Trouble concentrating - - 0  Moving slowly or fidgety/restless - - 0  Suicidal thoughts - - 0  PHQ-9 Score - - 1     Objective:   BP 132/82   Pulse 61   Temp 98 F (36.7 C)   Ht 5\' 6"  (1.676 m)   Wt 116 lb (52.6 kg)   SpO2 98%   BMI 18.72 kg/m  Vitals:   08/27/19 0952  BP: 132/82  Pulse: 61  Temp: 98 F (36.7 C)  SpO2: 98%  Weight: 116 lb (52.6 kg)  Height: 5\' 6"  (1.676 m)  Body mass index is 18.72 kg/m.   Physical Exam Vitals signs reviewed.  Constitutional:      Appearance: Normal appearance.     Comments: Thin WF NAD.  HENT:     Head: Normocephalic and atraumatic.     Right Ear: External ear normal.     Left Ear: External ear normal.  Eyes:     General: No scleral icterus.    Conjunctiva/sclera: Conjunctivae normal.  Cardiovascular:     Rate and Rhythm: Normal rate and regular rhythm.     Pulses: Normal pulses.     Heart sounds: Normal heart sounds.  Pulmonary:     Effort: Pulmonary effort is normal.     Breath sounds: Normal breath sounds.  Abdominal:     Palpations: Abdomen is soft.  Skin:    General: Skin is warm and dry.  Neurological:     General: No focal deficit present.     Mental Status: She is alert and oriented to person, place, and time.  Psychiatric:        Mood and Affect:  Mood normal.        Behavior: Behavior normal.        Thought Content: Thought content normal.        Judgment: Judgment normal.      No results found for any visits on 08/27/19.     Assessment & Plan    1. Cardiac arrhythmia, unspecified cardiac arrhythmia type  - Comprehensive metabolic panel  2. Osteoporosis without current pathological fracture, unspecified osteoporosis type Refer for consideration of infusion--pt has had 5 years Fosamax,  3. Fatigue, unspecified type Multifactorial,mild. - CBC with Differential/Platelet - TSH  4. Asymptomatic varicose veins   5. Osteoarthritis of multiple joints, unspecified osteoarthritis type      [name], MD  [facility]

## 2019-08-26 NOTE — Progress Notes (Signed)
Subjective:   Alisha Dean is a 85 y.o. female who presents for Medicare Annual (Subsequent) preventive examination.  Review of Systems:  N/A  Cardiac Risk Factors include: advanced age (>76men, >21 women);hypertension     Objective:     Vitals: BP 132/82 (BP Location: Left Arm)   Pulse 61   Temp 98 F (36.7 C) (Oral)   Ht 5\' 6"  (1.676 m)   Wt 116 lb 12.8 oz (53 kg)   BMI 18.85 kg/m   Body mass index is 18.85 kg/m.  Advanced Directives 08/27/2019 08/29/2018 08/20/2018 12/31/2015  Does Patient Have a Medical Advance Directive? Yes Yes Yes Yes  Type of Paramedic of [place];Living will [place];Living will [place];Living will Living will;Healthcare Power of [place] in Chart? No - copy requested No - copy requested No - copy requested -    Tobacco Social History   Tobacco Use  Smoking Status Former Smoker  . Packs/day: 0.25  . Years: 11.00  . Pack years: 2.75  . Types: Cigarettes  . Quit date: 57  . Years since quitting: 51.8  Smokeless Tobacco Never Used     Counseling given: Not Answered   Clinical Intake:  Pre-visit preparation completed: Yes  Pain : No/denies pain Pain Score: 0-No pain     Nutritional Status: BMI <19  Underweight Nutritional Risks: None Diabetes: No  How often do you need to have someone help you when you read instructions, pamphlets, or other written materials from your doctor or pharmacy?: 1 - Never  Interpreter Needed?: No  Information entered by :: [facility], LPN  Past Medical History:  Diagnosis Date  . Arrhythmia    tachycardia - controlled by diltiazem  . Arthritis    wrists, hands  . Atrial fibrillation ([place]) 2012    1 episode during pnuemonia  . Carpal tunnel syndrome of left wrist   . Carpal tunnel syndrome on left   . Osteoporosis    Past Surgical History:  Procedure Laterality Date  .  APPENDECTOMY    . CARPAL TUNNEL RELEASE Left 09/05/2018   Procedure: CARPAL TUNNEL RELEASE ENDOSCOPIC;  Surgeon: Corky Mull, MD;  Location: [facility];  Service: Orthopedics;  Laterality: Left;  . icm implant    . LEEP  1997  . LOOP RECORDER INSERTION N/A 12/29/2017   Procedure: LOOP RECORDER INSERTION;  Surgeon: Isaias Cowman, MD;  Location: [facility];  Service: Cardiovascular;  Laterality: N/A;  . MASTECTOMY Right   . right masectomy     Family History  Problem Relation Age of Onset  . Dementia Mother   . Depression Mother   . Migraines Mother   . Coronary artery disease Father   . Diabetes Mellitus I Father   . Prostate cancer Father   . Crohn's disease Sister   . Dementia Sister   . Dementia Brother   . Heart disease Brother   . Bipolar disorder Brother    Social History   Socioeconomic History  . Marital status: Married    Spouse name: Not on file  . Number of children: 5  . Years of education: Not on file  . Highest education level: Bachelor's degree (e.g., BA, AB, BS)  Occupational History  . Occupation: retired  Scientific laboratory technician  . Financial resource strain: Not hard at all  . Food insecurity    Worry: Never true    Inability: Never true  .  Transportation needs    Medical: No    Non-medical: No  Tobacco Use  . Smoking status: Former Smoker    Packs/day: 0.25    Years: 11.00    Pack years: 2.75    Types: Cigarettes    Quit date: 1969    Years since quitting: 51.8  . Smokeless tobacco: Never Used  Substance and Sexual Activity  . Alcohol use: Yes    Alcohol/week: 6.0 standard drinks    Types: 6 Glasses of wine per week    Comment:    . Drug use: No  . Sexual activity: Not on file  Lifestyle  . Physical activity    Days per week: 3 days    Minutes per session: 90 min  . Stress: Not at all  Relationships  . Social Herbalist on phone: Patient refused    Gets together: Patient refused    Attends religious service:  Patient refused    Active member of club or organization: Patient refused    Attends meetings of clubs or organizations: Patient refused    Relationship status: Patient refused  Other Topics Concern  . Not on file  Social History Narrative  . Not on file    Outpatient Encounter Medications as of 08/27/2019  Medication Sig  . acetaminophen (TYLENOL) 325 MG tablet Take 650 mg by mouth every 6 (six) hours as needed for moderate pain or headache.  Marland Kitchen aspirin EC 81 MG tablet Take 81 mg by mouth daily.   . calcium citrate-vitamin D (CALCIUM + D) 315-200 MG-UNIT tablet Take 1 tablet by mouth daily.   Marland Kitchen diltiazem (CARDIZEM CD) 120 MG 24 hr capsule Take 120 mg by mouth daily.   . Polyethylene Glycol 3350 (PEG 3350) 17 GM/SCOOP POWD Take by mouth daily.   . traMADol (ULTRAM) 50 MG tablet Take 1 tablet (50 mg total) by mouth every 6 (six) hours as needed. (Patient not taking: Reported on 08/27/2019)   No facility-administered encounter medications on file as of 08/27/2019.     Activities of Daily Living In your present state of health, do you have any difficulty performing the following activities: 08/27/2019 09/05/2018  Hearing? N N  Vision? N N  Difficulty concentrating or making decisions? N N  Walking or climbing stairs? N N  Dressing or bathing? N N  Doing errands, shopping? N -  Preparing Food and eating ? N -  Using the Toilet? N -  In the past six months, have you accidently leaked urine? N -  Do you have problems with loss of bowel control? N -  Managing your Medications? N -  Managing your Finances? N -  Housekeeping or managing your Housekeeping? N -  Some recent data might be hidden    Patient Care Team: Jerrol Banana., MD as PCP - General (Family Medicine) Ubaldo Glassing, Javier Docker, MD as Consulting Physician (Cardiology) Anabel Bene, MD as Referring Physician (Neurology) Poggi, Marshall Cork, MD as Consulting Physician (Surgery) Melrose Nakayama, MD as Consulting Physician  (Orthopedic Surgery)    Assessment:   This is a routine wellness examination for [name].  Exercise Activities and Dietary recommendations Current Exercise Habits: Structured exercise class, Type of exercise: strength training/weights;stretching;treadmill;walking, Time (Minutes): > 60, Frequency (Times/Week): 3, Weekly Exercise (Minutes/Week): 0, Intensity: Moderate, Exercise limited by: None identified  Goals    . DIET - INCREASE WATER INTAKE     Recommend increasing water intake to 6-8 glasses a day.  Fall Risk: Fall Risk  08/27/2019 08/27/2019 08/20/2018 06/01/2018 02/14/2017  Falls in the past year? 0 0 No No No  Comment - - - Emmi Telephone Survey: data to providers prior to load -  Number falls in past yr: 0 0 - - -  Injury with Fall? 0 0 - - -  Follow up - - - - -    FALL RISK PREVENTION PERTAINING TO THE HOME:  Any stairs in or around the home? Yes  If so, are there any without handrails? No   Home free of loose throw rugs in walkways, pet beds, electrical cords, etc? Yes  Adequate lighting in your home to reduce risk of falls? Yes   ASSISTIVE DEVICES UTILIZED TO PREVENT FALLS:  Life alert? No  Use of a cane, walker or w/c? No  Grab bars in the bathroom? No  Shower chair or bench in shower? Yes  Elevated toilet seat or a handicapped toilet? No    TIMED UP AND GO:  Was the test performed? No .    Depression Screen PHQ 2/9 Scores 08/27/2019 08/20/2018 02/14/2017 12/31/2015  PHQ - 2 Score 0 0 0 0  PHQ- 9 Score - - 1 -     Cognitive Function: Declined today.         Immunization History  Administered Date(s) Administered  . Influenza-Unspecified 09/01/2015  . Pneumococcal Conjugate-13 03/19/2014  . Pneumococcal Polysaccharide-23 01/19/2011  . Tdap 04/27/2011    Qualifies for Shingles Vaccine? Yes . Due for Shingrix. Education has been provided regarding the importance of this vaccine. Pt has been advised to call insurance company to determine out  of pocket expense. Advised may also receive vaccine at local pharmacy or Health Dept. Verbalized acceptance and understanding.  Tdap: Up to date  Flu Vaccine: Due for Flu vaccine. Does the patient want to receive this vaccine today?  Yes .   Pneumococcal Vaccine: Completed series  Screening Tests Health Maintenance  Topic Date Due  . TETANUS/TDAP  04/26/2021  . DEXA SCAN  05/12/2021  . INFLUENZA VACCINE  Completed  . PNA vac Low Risk Adult  Completed    Cancer Screenings:  Colorectal Screening: No longer required.   Mammogram: No longer required.   Bone Density: Completed 05/13/19. Results reflect OSTEOPOROSIS. Repeat every 2 years.   Lung Cancer Screening: (Low Dose CT Chest recommended if Age 78-80 years, 30 pack-year currently smoking OR have quit w/in 15years.) does not qualify.   Additional Screening:  Vision Screening: Recommended annual ophthalmology exams for early detection of glaucoma and other disorders of the eye.  Dental Screening: Recommended annual dental exams for proper oral hygiene  Community Resource Referral:  CRR required this visit?  No       Plan:  I have personally reviewed and addressed the Medicare Annual Wellness questionnaire and have noted the following in the patient's chart:  A. Medical and social history B. Use of alcohol, tobacco or illicit drugs  C. Current medications and supplements D. Functional ability and status E.  Nutritional status F.  Physical activity G. Advance directives H. List of other physicians I.  Hospitalizations, surgeries, and ER visits in previous 12 months J.  [place] such as hearing and vision if needed, cognitive and depression L. Referrals and appointments   In addition, I have reviewed and discussed with patient certain preventive protocols, quality metrics, and best practice recommendations. A written personalized care plan for preventive services as well as general preventive health  recommendations  were provided to patient. Nurse Health Advisor  Signed,    Adonias Demore [place], [place]  [id number] Nurse Health Advisor   Nurse Notes: None.

## 2019-08-27 ENCOUNTER — Other Ambulatory Visit: Payer: Self-pay

## 2019-08-27 ENCOUNTER — Ambulatory Visit (INDEPENDENT_AMBULATORY_CARE_PROVIDER_SITE_OTHER): Payer: Medicare Other

## 2019-08-27 ENCOUNTER — Ambulatory Visit (INDEPENDENT_AMBULATORY_CARE_PROVIDER_SITE_OTHER): Payer: Medicare Other | Admitting: Family Medicine

## 2019-08-27 VITALS — BP 132/82 | HR 61 | Temp 98.0°F | Ht 66.0 in | Wt 116.0 lb

## 2019-08-27 VITALS — BP 132/82 | HR 61 | Temp 98.0°F | Ht 66.0 in | Wt 116.8 lb

## 2019-08-27 DIAGNOSIS — M159 Polyosteoarthritis, unspecified: Secondary | ICD-10-CM | POA: Diagnosis not present

## 2019-08-27 DIAGNOSIS — I499 Cardiac arrhythmia, unspecified: Secondary | ICD-10-CM | POA: Diagnosis not present

## 2019-08-27 DIAGNOSIS — R5383 Other fatigue: Secondary | ICD-10-CM

## 2019-08-27 DIAGNOSIS — Z23 Encounter for immunization: Secondary | ICD-10-CM | POA: Diagnosis not present

## 2019-08-27 DIAGNOSIS — Z0000 Encounter for general adult medical examination without abnormal findings: Secondary | ICD-10-CM | POA: Diagnosis not present

## 2019-08-27 DIAGNOSIS — M810 Age-related osteoporosis without current pathological fracture: Secondary | ICD-10-CM

## 2019-08-27 DIAGNOSIS — I8390 Asymptomatic varicose veins of unspecified lower extremity: Secondary | ICD-10-CM

## 2019-08-27 NOTE — Patient Instructions (Signed)
[name] , Thank you for taking time to come for your Medicare Wellness Visit. I appreciate your ongoing commitment to your health goals. Please review the following plan we discussed and let me know if I can assist you in the future.   Screening recommendations/referrals: Colonoscopy: No longer required.  Mammogram: No longer required.  Bone Density: Up to date, due 04/2021 Recommended yearly ophthalmology/optometry visit for glaucoma screening and checkup Recommended yearly dental visit for hygiene and checkup  Vaccinations: Influenza vaccine: Administered today.  Pneumococcal vaccine: Completed series Tdap vaccine: Up to date, due 03/2021 Shingles vaccine: Pt declines today.     Advanced directives: Please bring a copy of your POA (Power of Attorney) and/or Living Will to your next appointment.   Conditions/risks identified: Continue to increase water intake to 6-8 8 oz glasses a day,   Next appointment: 9:40 AM with Dr Rosanna Randy.    Preventive Care 83 Years and Older, Female Preventive care refers to lifestyle choices and visits with your health care provider that can promote health and wellness. What does preventive care include?  A yearly physical exam. This is also called an annual well check.  Dental exams once or twice a year.  Routine eye exams. Ask your health care provider how often you should have your eyes checked.  Personal lifestyle choices, including:  Daily care of your teeth and gums.  Regular physical activity.  Eating a healthy diet.  Avoiding tobacco and drug use.  Limiting alcohol use.  Practicing safe sex.  Taking low-dose aspirin every day.  Taking vitamin and mineral supplements as recommended by your health care provider. What happens during an annual well check? The services and screenings done by your health care provider during your annual well check will depend on your age, overall health, lifestyle risk factors, and family history of  disease. Counseling  Your health care provider may ask you questions about your:  Alcohol use.  Tobacco use.  Drug use.  Emotional well-being.  Home and relationship well-being.  Sexual activity.  Eating habits.  History of falls.  Memory and ability to understand (cognition).  Work and work Statistician.  Reproductive health. Screening  You may have the following tests or measurements:  Height, weight, and BMI.  Blood pressure.  Lipid and cholesterol levels. These may be checked every 5 years, or more frequently if you are over 39 years old.  Skin check.  Lung cancer screening. You may have this screening every year starting at age 83 if you have a 30-pack-year history of smoking and currently smoke or have quit within the past 15 years.  Fecal occult blood test (FOBT) of the stool. You may have this test every year starting at age 71.  Flexible sigmoidoscopy or colonoscopy. You may have a sigmoidoscopy every 5 years or a colonoscopy every 10 years starting at age 83.  Hepatitis C blood test.  Hepatitis B blood test.  Sexually transmitted disease (STD) testing.  Diabetes screening. This is done by checking your blood sugar (glucose) after you have not eaten for a while (fasting). You may have this done every 83 years.  Bone density scan. This is done to screen for osteoporosis. You may have this done starting at age 83.  Mammogram. This may be done every 1-2 years. Talk to your health care provider about how often you should have regular mammograms. Talk with your health care provider about your test results, treatment options, and if necessary, the need for more tests. Vaccines  Your health care provider may recommend certain vaccines, such as:  Influenza vaccine. This is recommended every year.  Tetanus, diphtheria, and acellular pertussis (Tdap, Td) vaccine. You may need a Td booster every 10 years.  Zoster vaccine. You may need this after age 83.   Pneumococcal 13-valent conjugate (PCV13) vaccine. One dose is recommended after age 83.  Pneumococcal polysaccharide (PPSV23) vaccine. One dose is recommended after age 83. Talk to your health care provider about which screenings and vaccines you need and how often you need them. This information is not intended to replace advice given to you by your health care provider. Make sure you discuss any questions you have with your health care provider. Document Released: 11/13/2015 Document Revised: 07/06/2016 Document Reviewed: 08/18/2015 Elsevier Interactive Patient Education  2017 [employer] Prevention in the Home Falls can cause injuries. They can happen to people of all ages. There are many things you can do to make your home safe and to help prevent falls. What can I do on the outside of my home?  Regularly fix the edges of walkways and driveways and fix any cracks.  Remove anything that might make you trip as you walk through a door, such as a raised step or threshold.  Trim any bushes or trees on the path to your home.  Use bright outdoor lighting.  Clear any walking paths of anything that might make someone trip, such as rocks or tools.  Regularly check to see if handrails are loose or broken. Make sure that both sides of any steps have handrails.  Any raised decks and porches should have guardrails on the edges.  Have any leaves, snow, or ice cleared regularly.  Use sand or salt on walking paths during winter.  Clean up any spills in your garage right away. This includes oil or grease spills. What can I do in the bathroom?  Use night lights.  Install grab bars by the toilet and in the tub and shower. Do not use towel bars as grab bars.  Use non-skid mats or decals in the tub or shower.  If you need to sit down in the shower, use a plastic, non-slip stool.  Keep the floor dry. Clean up any water that spills on the floor as soon as it happens.  Remove soap  buildup in the tub or shower regularly.  Attach bath mats securely with double-sided non-slip rug tape.  Do not have throw rugs and other things on the floor that can make you trip. What can I do in the bedroom?  Use night lights.  Make sure that you have a light by your bed that is easy to reach.  Do not use any sheets or blankets that are too big for your bed. They should not hang down onto the floor.  Have a firm chair that has side arms. You can use this for support while you get dressed.  Do not have throw rugs and other things on the floor that can make you trip. What can I do in the kitchen?  Clean up any spills right away.  Avoid walking on wet floors.  Keep items that you use a lot in easy-to-reach places.  If you need to reach something above you, use a strong step stool that has a grab bar.  Keep electrical cords out of the way.  Do not use floor polish or wax that makes floors slippery. If you must use wax, use non-skid floor wax.  Do  not have throw rugs and other things on the floor that can make you trip. What can I do with my stairs?  Do not leave any items on the stairs.  Make sure that there are handrails on both sides of the stairs and use them. Fix handrails that are broken or loose. Make sure that handrails are as long as the stairways.  Check any carpeting to make sure that it is firmly attached to the stairs. Fix any carpet that is loose or worn.  Avoid having throw rugs at the top or bottom of the stairs. If you do have throw rugs, attach them to the floor with carpet tape.  Make sure that you have a light switch at the top of the stairs and the bottom of the stairs. If you do not have them, ask someone to add them for you. What else can I do to help prevent falls?  Wear shoes that:  Do not have high heels.  Have rubber bottoms.  Are comfortable and fit you well.  Are closed at the toe. Do not wear sandals.  If you use a stepladder:  Make  sure that it is fully opened. Do not climb a closed stepladder.  Make sure that both sides of the stepladder are locked into place.  Ask someone to hold it for you, if possible.  Clearly mark and make sure that you can see:  Any grab bars or handrails.  First and last steps.  Where the edge of each step is.  Use tools that help you move around (mobility aids) if they are needed. These include:  Canes.  Walkers.  Scooters.  Crutches.  Turn on the lights when you go into a dark area. Replace any light bulbs as soon as they burn out.  Set up your furniture so you have a clear path. Avoid moving your furniture around.  If any of your floors are uneven, fix them.  If there are any pets around you, be aware of where they are.  Review your medicines with your doctor. Some medicines can make you feel dizzy. This can increase your chance of falling. Ask your doctor what other things that you can do to help prevent falls. This information is not intended to replace advice given to you by your health care provider. Make sure you discuss any questions you have with your health care provider. Document Released: 08/13/2009 Document Revised: 03/24/2016 Document Reviewed: 11/21/2014 Elsevier Interactive Patient Education  2017 [employer].

## 2019-08-28 LAB — COMPREHENSIVE METABOLIC PANEL
ALT: 22 IU/L (ref 0–32)
AST: 34 IU/L (ref 0–40)
Albumin/Globulin Ratio: 2.4 — ABNORMAL HIGH (ref 1.2–2.2)
Albumin: 4.7 g/dL — ABNORMAL HIGH (ref 3.6–4.6)
Alkaline Phosphatase: 108 IU/L (ref 39–117)
BUN/Creatinine Ratio: 18 (ref 12–28)
BUN: 14 mg/dL (ref 8–27)
Bilirubin Total: 0.2 mg/dL (ref 0.0–1.2)
CO2: 24 mmol/L (ref 20–29)
Calcium: 10.3 mg/dL (ref 8.7–10.3)
Chloride: 97 mmol/L (ref 96–106)
Creatinine, Ser: 0.77 mg/dL (ref 0.57–1.00)
GFR calc Af Amer: 82 mL/min/{1.73_m2} (ref >59)
GFR calc non Af Amer: 71 mL/min/{1.73_m2} (ref >59)
Globulin, Total: 2 g/dL (ref 1.5–4.5)
Glucose: 92 mg/dL (ref 65–99)
Potassium: 4.4 mmol/L (ref 3.5–5.2)
Sodium: 136 mmol/L (ref 134–144)
Total Protein: 6.7 g/dL (ref 6.0–8.5)

## 2019-08-28 LAB — TSH: TSH: 3.94 u[IU]/mL (ref 0.450–4.500)

## 2019-08-28 LAB — CBC WITH DIFFERENTIAL/PLATELET
Basophils Absolute: 0 10*3/uL (ref 0.0–0.2)
Basos: 1 %
EOS (ABSOLUTE): 0.1 10*3/uL (ref 0.0–0.4)
Eos: 3 %
Hematocrit: 40.8 % (ref 34.0–46.6)
Hemoglobin: 14 g/dL (ref 11.1–15.9)
Immature Grans (Abs): 0 10*3/uL (ref 0.0–0.1)
Immature Granulocytes: 0 %
Lymphocytes Absolute: 1.5 10*3/uL (ref 0.7–3.1)
Lymphs: 32 %
MCH: 31.1 pg (ref 26.6–33.0)
MCHC: 34.3 g/dL (ref 31.5–35.7)
MCV: 91 fL (ref 79–97)
Monocytes Absolute: 0.4 10*3/uL (ref 0.1–0.9)
Monocytes: 8 %
Neutrophils Absolute: 2.6 10*3/uL (ref 1.4–7.0)
Neutrophils: 56 %
Platelets: 264 10*3/uL (ref 150–450)
RBC: 4.5 x10E6/uL (ref 3.77–5.28)
RDW: 12.5 % (ref 11.7–15.4)
WBC: 4.6 10*3/uL (ref 3.4–10.8)

## 2019-09-02 ENCOUNTER — Telehealth: Payer: Self-pay

## 2019-09-02 NOTE — Telephone Encounter (Signed)
Patient advised as directed below. 

## 2019-09-02 NOTE — Telephone Encounter (Signed)
-----   Message from Jerrol Banana., MD sent at 08/30/2019 11:25 AM EDT ----- Labs good.

## 2019-09-12 ENCOUNTER — Other Ambulatory Visit: Payer: Self-pay | Admitting: *Deleted

## 2019-09-12 DIAGNOSIS — M810 Age-related osteoporosis without current pathological fracture: Secondary | ICD-10-CM

## 2019-09-12 DIAGNOSIS — M159 Polyosteoarthritis, unspecified: Secondary | ICD-10-CM

## 2019-09-17 NOTE — Progress Notes (Signed)
[facility]  [address] [place], [place] [address] Phone: [phone]  Fax: [phone]   Clinic Day:  09/20/2019  Referring physician: Jerrol Banana.,*  Chief Complaint: Alisha Dean is a 85 y.o. female with osteoporosis who is referred in consultation by Dr. Delfino Lovett L. Cranford Mon. for assessment and management.   HPI:  The patient was seen by Dr. Rosanna Randy on 08/27/2019. She had joint swelling and arthralgias. Dr. Rosanna Randy wanted to refer her for consideration of infusion. She had been on Fosamax for 5 years. Labs revealed an unremarkable CBC, albumin 4.7, and TSH 3.940.   She has a history of atrial fibrillation. She was last seen by Dr. Ubaldo Glassing on 04/22/2019. She was doing well. She denied any recent syncopal episodes. She was unaware of rapid or irregular heartbeats and has no symptoms of lightheadedness. Her [employer] device report showed no evidence of significant arrhythmia, atrial fibrilaltion, or pauses.   Bilateral mammogram on 05/13/2019 revealed no evidence of malignancy.   Bone density on 09/09/2005 revealed osteopenia with a T-score of -1.4 in L1-L4, -2.1 in the left femoral neck, - 2.1 in the left total hip and -2.3 in the forearm Bone density on 04/22/2014 revealed osteopenia with a T-score of -0.4 in L1-L4, -1.4 in the left femoral neck, - 1.4 in the left total hip. Bone density on 05/13/2019 revealed osteoporosis with a T-score of -2.8 at the total right femur.  She has a history of arthritis, vitamin D deficiency (12/25/2017), and malignant tumor of breast (12/25/2017). She is followed by Dr. Melrose Nakayama for her bilateral carpal tunnel syndrome and neuropathy.   Symptomatically, she feels "good". She was on Fosamax x 20 years (early 74's) and stopped the medication 5 years ago. She never received hormonal therapy for her stage I breast cancer years ago. Her surgeon was Dr. Bary Castilla. She is being treated for atrial fibrillation  by Dr. Ubaldo Glassing. She is not on any blood thinners.   She denies any B symptoms. She has no chest pains. Her constipation is well controlled on Miralax. 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 She visits the dentist every 3 months for teeth cleaning. She was last seen by her dentist, Dr. Radene Knee, x3 weeks ago for a chipped tooth. She is taking calcium + vitamin D daily.    Past Medical History:  Diagnosis Date  . Arrhythmia    tachycardia - controlled by diltiazem  . Arthritis    wrists, hands  . Atrial fibrillation ([place]) 2012    1 episode during pnuemonia  . Carpal tunnel syndrome of left wrist   . Carpal tunnel syndrome on left   . Osteoporosis     Past Surgical History:  Procedure Laterality Date  . APPENDECTOMY    . CARPAL TUNNEL RELEASE Left 09/05/2018   Procedure: CARPAL TUNNEL RELEASE ENDOSCOPIC;  Surgeon: Corky Mull, MD;  Location: [facility];  Service: Orthopedics;  Laterality: Left;  . icm implant    . LEEP  1997  . LOOP RECORDER INSERTION N/A 12/29/2017   Procedure: LOOP RECORDER INSERTION;  Surgeon: Isaias Cowman, MD;  Location: [facility];  Service: Cardiovascular;  Laterality: N/A;  . MASTECTOMY Right   . right masectomy      Family History  Problem Relation Age of Onset  . Dementia Mother   . Depression Mother   . Migraines Mother   . Coronary artery disease Father   . Diabetes Mellitus I Father   .  Prostate cancer Father   . Crohn's disease Sister   . Dementia Sister   . Dementia Brother   . Heart disease Brother   . Bipolar disorder Brother   Her paternal aunt had osteoporosis. Her father died from prostate cancer. No other cancer or blood disorders are known to run in the family.    Social History:  reports that she quit smoking about 51 years ago. Her smoking use included cigarettes. She has a 2.75 pack-year smoking history. She has never used smokeless tobacco. She reports current alcohol use of about 6.0 standard drinks of  alcohol per week. She reports that she does not use drugs.  She drinks a glass of wine every 4-5 days a week. She quit smoking years ago. She denies any exposure to radiations or toxins. 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 She likes to go to the gym regularly. The patient is alone today.  Allergies: No Known Allergies  Current Medications: Current Outpatient Medications  Medication Sig Dispense Refill  . acetaminophen (TYLENOL) 325 MG tablet Take 650 mg by mouth every 6 (six) hours as needed for moderate pain or headache.    Marland Kitchen aspirin EC 81 MG tablet Take 81 mg by mouth daily.     . calcium citrate-vitamin D (CALCIUM + D) 315-200 MG-UNIT tablet Take 1 tablet by mouth daily.     Marland Kitchen diltiazem (CARDIZEM CD) 120 MG 24 hr capsule Take 120 mg by mouth daily.     . Polyethylene Glycol 3350 (PEG 3350) 17 GM/SCOOP POWD Take by mouth daily.      No current facility-administered medications for this visit.     Review of Systems  Constitutional: Negative for chills, diaphoresis, fever, malaise/fatigue and weight loss.       Feels "good".  HENT: Negative.  Negative for congestion, ear discharge, ear pain, hearing loss, nosebleeds, sinus pain and sore throat.   Eyes: Negative.  Negative for blurred vision and double vision.  Respiratory: Negative.  Negative for cough, hemoptysis, sputum production and shortness of breath.   Cardiovascular: Negative for chest pain, palpitations and leg swelling.       Atrial fibrillaltion.  Gastrointestinal: Negative.  Negative for abdominal pain, blood in stool, constipation (on miralax; improving), diarrhea, melena, nausea and vomiting.  Genitourinary: Negative.  Negative for dysuria, frequency, hematuria and urgency.  Musculoskeletal: Positive for joint pain (osteoarthritis; hands; neck). Negative for back pain, myalgias and neck pain.  Skin: Negative.  Negative for itching and rash.  Neurological: Negative.  Negative for dizziness, tingling,  sensory change, focal weakness, weakness and headaches.  Endo/Heme/Allergies: Negative.  Does not bruise/bleed easily.  Psychiatric/Behavioral: Negative.  Negative for depression and memory loss. The patient is not nervous/anxious and does not have insomnia.   All other systems reviewed and are negative.  Performance status (ECOG): 0  Vitals Blood pressure (!) 142/71, pulse 62, temperature (!) 97.3 F (36.3 C), temperature source Tympanic, resp. rate 18, height 5\' 6"  (1.676 m), weight 115 lb 6.6 oz (52.3 kg), SpO2 100 %.   Physical Exam  Constitutional: She is oriented to person, place, and time. She appears well-developed and well-nourished. No distress.  HENT:  Head: Normocephalic and atraumatic.  Mouth/Throat: Oropharynx is clear and moist. No oropharyngeal exudate.  Short styled dark brown hair.  Mask  Eyes: Pupils are equal, round, and reactive to light. Conjunctivae and EOM are normal. No scleral icterus.  Glasses.  Neck: Normal range of motion. Neck supple. No JVD  present.  Cardiovascular: Normal rate and normal heart sounds.  No murmur heard. Pulmonary/Chest: Effort normal and breath sounds normal. No respiratory distress. She has no wheezes. She has no rales.  Abdominal: Soft. Bowel sounds are normal. She exhibits no distension and no mass. There is no abdominal tenderness. There is no rebound and no guarding.  Musculoskeletal: Normal range of motion.        General: No tenderness or edema.  Lymphadenopathy:       Head (right side): No preauricular and no posterior auricular adenopathy present.       Head (left side): No preauricular, no posterior auricular and no occipital adenopathy present.    She has no cervical adenopathy.    She has no axillary adenopathy.       Right: No inguinal and no supraclavicular adenopathy present.       Left: No inguinal and no supraclavicular adenopathy present.  Neurological: She is alert and oriented to person, place, and time.  Skin: Skin  is warm and dry. No rash noted. She is not diaphoretic. No erythema. No pallor.  Psychiatric: She has a normal mood and affect. Her behavior is normal. Judgment and thought content normal.  Nursing note and vitals reviewed.   No visits with results within 3 Day(s) from this visit.  Latest known visit with results is:  Office Visit on 08/27/2019  Component Date Value Ref Range Status  . WBC 08/27/2019 4.6  3.4 - 10.8 x10E3/uL Final  . RBC 08/27/2019 4.50  3.77 - 5.28 x10E6/uL Final  . Hemoglobin 08/27/2019 14.0  11.1 - 15.9 g/dL Final  . Hematocrit 08/27/2019 40.8  34.0 - 46.6 % Final  . MCV 08/27/2019 91  79 - 97 fL Final  . MCH 08/27/2019 31.1  26.6 - 33.0 pg Final  . MCHC 08/27/2019 34.3  31.5 - 35.7 g/dL Final  . RDW 08/27/2019 12.5  11.7 - 15.4 % Final  . Platelets 08/27/2019 264  150 - 450 x10E3/uL Final  . Neutrophils 08/27/2019 56  Not Estab. % Final  . Lymphs 08/27/2019 32  Not Estab. % Final  . Monocytes 08/27/2019 8  Not Estab. % Final  . Eos 08/27/2019 3  Not Estab. % Final  . Basos 08/27/2019 1  Not Estab. % Final  . Neutrophils Absolute 08/27/2019 2.6  1.4 - 7.0 x10E3/uL Final  . Lymphocytes Absolute 08/27/2019 1.5  0.7 - 3.1 x10E3/uL Final  . Monocytes Absolute 08/27/2019 0.4  0.1 - 0.9 x10E3/uL Final  . EOS (ABSOLUTE) 08/27/2019 0.1  0.0 - 0.4 x10E3/uL Final  . Basophils Absolute 08/27/2019 0.0  0.0 - 0.2 x10E3/uL Final  . Immature Granulocytes 08/27/2019 0  Not Estab. % Final  . Immature Grans (Abs) 08/27/2019 0.0  0.0 - 0.1 x10E3/uL Final  . Glucose 08/27/2019 92  65 - 99 mg/dL Final  . BUN 08/27/2019 14  8 - 27 mg/dL Final  . Creatinine, Ser 08/27/2019 0.77  0.57 - 1.00 mg/dL Final  . GFR calc non Af Amer 08/27/2019 71  >59 mL/min/1.73 Final  . GFR calc Af Amer 08/27/2019 82  >59 mL/min/1.73 Final  . BUN/Creatinine Ratio 08/27/2019 18  12 - 28 Final  . Sodium 08/27/2019 136  134 - 144 mmol/L Final  . Potassium 08/27/2019 4.4  3.5 - 5.2 mmol/L Final  . Chloride  08/27/2019 97  96 - 106 mmol/L Final  . CO2 08/27/2019 24  20 - 29 mmol/L Final  . Calcium 08/27/2019 10.3  8.7 - 10.3  mg/dL Final  . Total Protein 08/27/2019 6.7  6.0 - 8.5 g/dL Final  . Albumin 08/27/2019 4.7* 3.6 - 4.6 g/dL Final  . Globulin, Total 08/27/2019 2.0  1.5 - 4.5 g/dL Final  . Albumin/Globulin Ratio 08/27/2019 2.4* 1.2 - 2.2 Final  . Bilirubin Total 08/27/2019 0.2  0.0 - 1.2 mg/dL Final  . Alkaline Phosphatase 08/27/2019 108  39 - 117 IU/L Final  . AST 08/27/2019 34  0 - 40 IU/L Final  . ALT 08/27/2019 22  0 - 32 IU/L Final  . TSH 08/27/2019 3.940  0.450 - 4.500 uIU/mL Final    Assessment:  Alisha Dean is a 85 y.o. female with osteoporosis.  She was on Fosamax for 20 years, but was discontinued 5 years ago.  Bone density on 09/09/2005 revealed osteopenia with a T-score of -1.4 in L1-L4, -2.1 in the left femoral neck, - 2.1 in the left total hip and -2.3 in the forearm.  Bone density on 04/22/2014 revealed osteopenia with a T-score of -0.4 in L1-L4, -1.4 in the left femoral neck, - 1.4 in the left total hip.  Bone density on 05/13/2019 revealed osteoporosis with a T-score of -2.8 at the total right femur.  She has a history of stage I breast cancer s/p mastectomy.  She did not receive endocrine therapy.  Bilateral mammogram on 05/13/2019 revealed no evidence of malignancy.   Symptomatically, she feels good.  She recently chipped a tooth.  Plan: 1.   Osteoporosis  Review medical history and use of Fosamax.  Discuss plan for Prolia per Dr Alben Spittle office.  Potential side effects reviewed.   Information provided.  Patient to see her dentist in 11/2019. She will get dental clearance now or in 11/2019. 2.   RTC for BMP and Prolia after above clearance. 3.   RTC 6 months after 1st Prolia injection for MD assessment, labs (BMP), and Prolia.   I discussed the assessment and treatment plan with the patient.  The patient was provided an opportunity to ask questions and  all were answered.  The patient agreed with the plan and demonstrated an understanding of the instructions.  The patient was advised to call back if the symptoms worsen or if the condition fails to improve as anticipated.  I provided 28 minutes of face-to-face time during this this encounter and > 50% was spent counseling as documented under my assessment and plan.     C. Mike Gip, MD, PhD    09/20/2019, 11:48 AM  I, Selena Batten, am acting as scribe for [place]. Mike Gip, MD, PhD.  I,  C. Mike Gip, MD, have reviewed the above documentation for accuracy and completeness, and I agree with the above.

## 2019-09-20 ENCOUNTER — Inpatient Hospital Stay: Payer: Medicare Other | Attending: Hematology and Oncology | Admitting: Hematology and Oncology

## 2019-09-20 ENCOUNTER — Other Ambulatory Visit: Payer: Self-pay

## 2019-09-20 ENCOUNTER — Encounter: Payer: Self-pay | Admitting: Hematology and Oncology

## 2019-09-20 ENCOUNTER — Inpatient Hospital Stay: Payer: Medicare Other

## 2019-09-20 VITALS — BP 142/71 | HR 62 | Temp 97.3°F | Resp 18 | Ht 66.0 in | Wt 115.4 lb

## 2019-09-20 DIAGNOSIS — Z7289 Other problems related to lifestyle: Secondary | ICD-10-CM

## 2019-09-20 DIAGNOSIS — I4891 Unspecified atrial fibrillation: Secondary | ICD-10-CM

## 2019-09-20 DIAGNOSIS — Z9010 Acquired absence of unspecified breast and nipple: Secondary | ICD-10-CM | POA: Diagnosis not present

## 2019-09-20 DIAGNOSIS — Z87891 Personal history of nicotine dependence: Secondary | ICD-10-CM | POA: Diagnosis not present

## 2019-09-20 DIAGNOSIS — Z8042 Family history of malignant neoplasm of prostate: Secondary | ICD-10-CM

## 2019-09-20 DIAGNOSIS — Z853 Personal history of malignant neoplasm of breast: Secondary | ICD-10-CM

## 2019-09-20 DIAGNOSIS — G5603 Carpal tunnel syndrome, bilateral upper limbs: Secondary | ICD-10-CM | POA: Diagnosis not present

## 2019-09-20 DIAGNOSIS — M810 Age-related osteoporosis without current pathological fracture: Secondary | ICD-10-CM | POA: Diagnosis not present

## 2019-09-20 NOTE — Patient Instructions (Signed)
Denosumab injection What is this medicine? DENOSUMAB (den oh sue mab) slows bone breakdown. Prolia is used to treat osteoporosis in women after menopause and in men, and in people who are taking corticosteroids for 6 months or more. Xgeva is used to treat a high calcium level due to cancer and to prevent bone fractures and other bone problems caused by multiple myeloma or cancer bone metastases. Xgeva is also used to treat giant cell tumor of the bone. This medicine may be used for other purposes; ask your health care provider or pharmacist if you have questions. COMMON BRAND NAME(S): Prolia, XGEVA What should I tell my health care provider before I take this medicine? They need to know if you have any of these conditions:  dental disease  having surgery or tooth extraction  infection  kidney disease  low levels of calcium or Vitamin D in the blood  malnutrition  on hemodialysis  skin conditions or sensitivity  thyroid or parathyroid disease  an unusual reaction to denosumab, other medicines, foods, dyes, or preservatives  pregnant or trying to get pregnant  breast-feeding How should I use this medicine? This medicine is for injection under the skin. It is given by a health care professional in a hospital or clinic setting. A special MedGuide will be given to you before each treatment. Be sure to read this information carefully each time. For Prolia, talk to your pediatrician regarding the use of this medicine in children. Special care may be needed. For Xgeva, talk to your pediatrician regarding the use of this medicine in children. While this drug may be prescribed for children as young as 13 years for selected conditions, precautions do apply. Overdosage: If you think you have taken too much of this medicine contact a poison control center or emergency room at once. NOTE: This medicine is only for you. Do not share this medicine with others. What if I miss a dose? It is  important not to miss your dose. Call your doctor or health care professional if you are unable to keep an appointment. What may interact with this medicine? Do not take this medicine with any of the following medications:  other medicines containing denosumab This medicine may also interact with the following medications:  medicines that lower your chance of fighting infection  steroid medicines like prednisone or cortisone This list may not describe all possible interactions. Give your health care provider a list of all the medicines, herbs, non-prescription drugs, or dietary supplements you use. Also tell them if you smoke, drink alcohol, or use illegal drugs. Some items may interact with your medicine. What should I watch for while using this medicine? Visit your doctor or health care professional for regular checks on your progress. Your doctor or health care professional may order blood tests and other tests to see how you are doing. Call your doctor or health care professional for advice if you get a fever, chills or sore throat, or other symptoms of a cold or flu. Do not treat yourself. This drug may decrease your body's ability to fight infection. Try to avoid being around people who are sick. You should make sure you get enough calcium and vitamin D while you are taking this medicine, unless your doctor tells you not to. Discuss the foods you eat and the vitamins you take with your health care professional. See your dentist regularly. Brush and floss your teeth as directed. Before you have any dental work done, tell your dentist you are   receiving this medicine. Do not become pregnant while taking this medicine or for 5 months after stopping it. Talk with your doctor or health care professional about your birth control options while taking this medicine. Women should inform their doctor if they wish to become pregnant or think they might be pregnant. There is a potential for serious side  effects to an unborn child. Talk to your health care professional or pharmacist for more information. What side effects may I notice from receiving this medicine? Side effects that you should report to your doctor or health care professional as soon as possible:  allergic reactions like skin rash, itching or hives, swelling of the face, lips, or tongue  bone pain  breathing problems  dizziness  jaw pain, especially after dental work  redness, blistering, peeling of the skin  signs and symptoms of infection like fever or chills; cough; sore throat; pain or trouble passing urine  signs of low calcium like fast heartbeat, muscle cramps or muscle pain; pain, tingling, numbness in the hands or feet; seizures  unusual bleeding or bruising  unusually weak or tired Side effects that usually do not require medical attention (report to your doctor or health care professional if they continue or are bothersome):  constipation  diarrhea  headache  joint pain  loss of appetite  muscle pain  runny nose  tiredness  upset stomach This list may not describe all possible side effects. Call your doctor for medical advice about side effects. You may report side effects to FDA at 1-800-FDA-1088. Where should I keep my medicine? This medicine is only given in a clinic, doctor's office, or other health care setting and will not be stored at home. NOTE: This sheet is a summary. It may not cover all possible information. If you have questions about this medicine, talk to your doctor, pharmacist, or health care provider.  2020 Elsevier/Gold Standard (2018-02-23 16:10:44)

## 2019-09-20 NOTE — Progress Notes (Signed)
Patient c/o joint pain only in the winter

## 2019-10-20 DIAGNOSIS — R002 Palpitations: Secondary | ICD-10-CM | POA: Insufficient documentation

## 2019-11-26 DIAGNOSIS — Z23 Encounter for immunization: Secondary | ICD-10-CM | POA: Diagnosis not present

## 2019-12-24 DIAGNOSIS — Z23 Encounter for immunization: Secondary | ICD-10-CM | POA: Diagnosis not present

## 2020-04-06 ENCOUNTER — Other Ambulatory Visit: Payer: Self-pay | Admitting: Family Medicine

## 2020-04-06 DIAGNOSIS — Z1231 Encounter for screening mammogram for malignant neoplasm of breast: Secondary | ICD-10-CM

## 2020-05-14 ENCOUNTER — Ambulatory Visit: Payer: Medicare Other

## 2020-07-02 ENCOUNTER — Other Ambulatory Visit: Payer: Self-pay

## 2020-07-02 ENCOUNTER — Ambulatory Visit
Admission: RE | Admit: 2020-07-02 | Discharge: 2020-07-02 | Disposition: A | Discharge status: Home / Self Care | Payer: Medicare Other | Source: Ambulatory Visit | Attending: Family Medicine | Admitting: Family Medicine

## 2020-07-02 DIAGNOSIS — Z1231 Encounter for screening mammogram for malignant neoplasm of breast: Secondary | ICD-10-CM

## 2020-08-31 NOTE — Progress Notes (Signed)
I,Alisha Dean,acting as a scribe for Alisha Durie, MD.,have documented all relevant documentation on the behalf of Alisha Durie, MD,as directed by  Alisha Durie, MD while in the presence of Alisha Durie, MD.   Established patient visit   Patient: Alisha Dean   DOB: July 09, 1933   86 y.o. Female  MRN: [id number] Visit Date: 09/01/2020  Today's healthcare provider: [name], MD   Chief Complaint  Patient presents with  . Follow-up   Subjective    HPI  Patient comes in today for follow-up of chronic problems.  She feels well except for some chronic arthritic pain.,  Especially some mild low back pain. She and her husband remain active and they have no complaints today. Patient had AWV with [facility] today at 9:00 am.    Osteoporosis without current pathological fracture, unspecified osteoporosis type From 08/26/2020-Referred for consideration of infusion--pt has had 5 years Fosamax.   Past Medical History:  Diagnosis Date  . Arrhythmia    tachycardia - controlled by diltiazem  . Arthritis    wrists, hands  . Atrial fibrillation ([place]) 2012    1 episode during pnuemonia  . Carpal tunnel syndrome of left wrist   . Carpal tunnel syndrome on left   . Osteoporosis        Medications: Outpatient Medications Prior to Visit  Medication Sig  . acetaminophen (TYLENOL) 325 MG tablet Take 650 mg by mouth every 6 (six) hours as needed for moderate pain or headache.  Marland Kitchen aspirin EC 81 MG tablet Take 81 mg by mouth daily.   . calcium citrate-vitamin D (CALCIUM + D) 315-200 MG-UNIT tablet Take 1 tablet by mouth daily. 600 mg daily  . diltiazem (CARDIZEM CD) 120 MG 24 hr capsule Take 120 mg by mouth daily.   . Polyethylene Glycol 3350 (PEG 3350) 17 GM/SCOOP POWD Take by mouth daily.  (Patient not taking: Reported on 09/01/2020)   No facility-administered medications prior to visit.    Review of Systems  Constitutional: Negative.   HENT:  Negative.   Eyes: Negative.   Respiratory: Negative.   Cardiovascular: Negative.   Gastrointestinal: Negative.   Endocrine: Negative.   Genitourinary: Negative.   Musculoskeletal: Positive for back pain.  Skin: Negative.   Allergic/Immunologic: Negative.   Neurological: Negative.   Hematological: Negative.   Psychiatric/Behavioral: Negative.   All other systems reviewed and are negative.      Objective     Vitals:  BP 136/72 (BP Location: Left Arm)  Pulse 57Important   Temp 98.2 F (36.8 C) (Oral)  Ht 5\' 6"  (1.676 m)  Wt 121 lb (54.9 kg)  SpO2 98%  BMI 19.53 kg/m  BSA 1.6 m  Pain [place] 4 (Loc: Back)     Physical Exam Constitutional:      Appearance: Normal appearance. She is normal weight.  HENT:     Head: Normocephalic and atraumatic.     Right Ear: Tympanic membrane, ear canal and external ear normal.     Left Ear: Tympanic membrane, ear canal and external ear normal.     Nose: Nose normal.     Mouth/Throat:     Mouth: Mucous membranes are moist.     Pharynx: Oropharynx is clear.  Eyes:     Extraocular Movements: Extraocular movements intact.     Conjunctiva/sclera: Conjunctivae normal.     Pupils: Pupils are equal, round, and reactive to light.  Cardiovascular:     Rate and Rhythm: Normal rate  and regular rhythm.     Pulses: Normal pulses.     Heart sounds: Normal heart sounds.  Pulmonary:     Effort: Pulmonary effort is normal.     Breath sounds: Normal breath sounds.  Abdominal:     General: Abdomen is flat. Bowel sounds are normal.     Palpations: Abdomen is soft.  Musculoskeletal:        General: Normal range of motion.     Cervical back: Normal range of motion and neck supple.  Skin:    General: Skin is warm and dry.  Neurological:     General: No focal deficit present.     Mental Status: She is alert and oriented to person, place, and time. Mental status is at baseline.  Psychiatric:        Mood and Affect: Mood normal.        Behavior:  Behavior normal.        Thought Content: Thought content normal.        Judgment: Judgment normal.       No results found for any visits on 09/01/20.  Assessment & Plan     1. Osteoarthritis of multiple joints, unspecified osteoarthritis type Try Voltaren gel topically.  2. Cardiac arrhythmia, unspecified cardiac arrhythmia type  - CBC w/Diff/Platelet - Comprehensive Metabolic Panel (CMET)  3. Osteoporosis without current pathological fracture, unspecified osteoporosis type Follow-up BMD when appropriate. - CBC w/Diff/Platelet  4. Pure hypercholesterolemia  - Lipid panel - TSH - Comprehensive Metabolic Panel (CMET)   Return in about 1 year (around 09/01/2021).      I, Alisha Durie, MD, have reviewed all documentation for this visit. The documentation on 09/06/20 for the exam, diagnosis, procedures, and orders are all accurate and complete.    Emalynn Clewis Cranford Mon, MD  [facility] [phone] (phone) [phone] (fax)  [place]

## 2020-08-31 NOTE — Progress Notes (Addendum)
Subjective:   Alisha Dean is a 86 y.o. female who presents for Medicare Annual (Subsequent) preventive examination.  Review of Systems    N/A  Cardiac Risk Factors include: advanced age (>48men, >66 women)     Objective:    Today's Vitals   09/01/20 0812  BP: 136/72  Pulse: (!) 57  Temp: 98.2 F (36.8 C)  TempSrc: Oral  SpO2: 98%  Weight: 121 lb (54.9 kg)  Height: 5\' 6"  (1.676 m)  PainSc: 4   PainLoc: Back   Body mass index is 19.53 kg/m.  Advanced Directives 09/01/2020 09/20/2019 08/27/2019 08/29/2018 08/20/2018 12/31/2015  Does Patient Have a Medical Advance Directive? Yes No Yes Yes Yes Yes  Type of Paramedic of [place];Living will - [place];Living will [place];Living will [place];Living will Living will;Healthcare Power of [place] in Chart? No - copy requested - No - copy requested No - copy requested No - copy requested -  Would patient like information on creating a medical advance directive? - No - Patient declined - - - -    Current Medications (verified) Outpatient Encounter Medications as of 09/01/2020  Medication Sig  . acetaminophen (TYLENOL) 325 MG tablet Take 650 mg by mouth every 6 (six) hours as needed for moderate pain or headache.  Marland Kitchen aspirin EC 81 MG tablet Take 81 mg by mouth daily.   . calcium citrate-vitamin D (CALCIUM + D) 315-200 MG-UNIT tablet Take 1 tablet by mouth daily. 600 mg daily  . diltiazem (CARDIZEM CD) 120 MG 24 hr capsule Take 120 mg by mouth daily.   . Polyethylene Glycol 3350 (PEG 3350) 17 GM/SCOOP POWD Take by mouth daily.  (Patient not taking: Reported on 09/01/2020)   No facility-administered encounter medications on file as of 09/01/2020.    Allergies (verified) Patient has no known allergies.   History: Past Medical History:  Diagnosis Date  . Arrhythmia    tachycardia - controlled by  diltiazem  . Arthritis    wrists, hands  . Atrial fibrillation ([place]) 2012    1 episode during pnuemonia  . Carpal tunnel syndrome of left wrist   . Carpal tunnel syndrome on left   . Osteoporosis    Past Surgical History:  Procedure Laterality Date  . APPENDECTOMY    . CARPAL TUNNEL RELEASE Left 09/05/2018   Procedure: CARPAL TUNNEL RELEASE ENDOSCOPIC;  Surgeon: Corky Mull, MD;  Location: [facility];  Service: Orthopedics;  Laterality: Left;  . icm implant    . LEEP  1997  . LOOP RECORDER INSERTION N/A 12/29/2017   Procedure: LOOP RECORDER INSERTION;  Surgeon: Isaias Cowman, MD;  Location: [facility];  Service: Cardiovascular;  Laterality: N/A;  . MASTECTOMY Right   . right masectomy     Family History  Problem Relation Age of Onset  . Dementia Mother   . Depression Mother   . Migraines Mother   . Coronary artery disease Father   . Diabetes Mellitus I Father   . Prostate cancer Father   . Crohn's disease Sister   . Dementia Sister   . Dementia Brother   . Heart disease Brother   . Bipolar disorder Brother    Social History   Socioeconomic History  . Marital status: Married    Spouse name: Not on file  . Number of children: 5  . Years of education: Not on file  .  Highest education level: Bachelor's degree (e.g., BA, AB, BS)  Occupational History  . Occupation: retired  Tobacco Use  . Smoking status: Former Smoker    Packs/day: 0.25    Years: 11.00    Pack years: 2.75    Types: Cigarettes    Quit date: 1969    Years since quitting: 52.8  . Smokeless tobacco: Never Used  Vaping Use  . Vaping Use: Never used  Substance and Sexual Activity  . Alcohol use: Yes    Alcohol/week: 7.0 standard drinks    Types: 7 Glasses of wine per week    Comment: 1 glass of wine nightly  . Drug use: No  . Sexual activity: Not on file  Other Topics Concern  . Not on file  Social History Narrative  . Not on file   Social Determinants of Health    Financial Resource Strain: Low Risk   . Difficulty of Paying Living Expenses: Not hard at all  Food Insecurity: No Food Insecurity  . Worried About Charity fundraiser in the Last Year: Never true  . Ran Out of Food in the Last Year: Never true  Transportation Needs: No Transportation Needs  . Lack of Transportation (Medical): No  . Lack of Transportation (Non-Medical): No  Physical Activity: Sufficiently Active  . Days of Exercise per Week: 3 days  . Minutes of Exercise per Session: 120 min  Stress: No Stress Concern Present  . Feeling of Stress : Not at all  Social Connections: Socially Integrated  . Frequency of Communication with Friends and Family: More than three times a week  . Frequency of Social Gatherings with Friends and Family: More than three times a week  . Attends Religious Services: More than 4 times per year  . Active Member of Clubs or Organizations: Yes  . Attends Archivist Meetings: More than 4 times per year  . Marital Status: Married    Tobacco Counseling Counseling given: Not Answered   Clinical Intake:  Pre-visit preparation completed: Yes  Pain : 0-10 Pain Score: 4  Pain Type: Acute pain Pain Location: Back Pain Orientation: Lower Pain Descriptors / Indicators: Aching Pain Frequency: Intermittent Pain Relieving Factors: No medications at this time. Plans to see Dr Latanya Maudlin tomorrow for pain.  Pain Relieving Factors: No medications at this time. Plans to see Dr Latanya Maudlin tomorrow for pain.  Nutritional Status: BMI of 19-24  Normal Nutritional Risks: None Diabetes: No  How often do you need to have someone help you when you read instructions, pamphlets, or other written materials from your doctor or pharmacy?: 1 - Never  Diabetic? No  Interpreter Needed?: No  Information entered by :: [facility], LPN   Activities of Daily Living In your present state of health, do you have any difficulty performing the following activities:  09/01/2020  Hearing? N  Vision? N  Difficulty concentrating or making decisions? N  Walking or climbing stairs? N  Dressing or bathing? N  Doing errands, shopping? N  Preparing Food and eating ? N  Using the Toilet? N  In the past six months, have you accidently leaked urine? N  Do you have problems with loss of bowel control? N  Managing your Medications? N  Managing your Finances? N  Housekeeping or managing your Housekeeping? N  Some recent data might be hidden    Patient Care Team: Jerrol Banana., MD as PCP - General (Family Medicine) Ubaldo Glassing, Javier Docker, MD as Consulting Physician (Cardiology) Gurney Maxin  E, MD as Referring Physician (Neurology) Poggi, Marshall Cork, MD as Consulting Physician (Surgery) Melrose Nakayama, MD as Consulting Physician (Orthopedic Surgery)  Indicate any recent Medical Services you may have received from other than [facility] providers in the past year (date may be approximate).     Assessment:   This is a routine wellness examination for Alisha Dean.  Hearing/Vision screen No exam data present  Dietary issues and exercise activities discussed: Current Exercise Habits: Structured exercise class, Type of exercise: strength training/weights;stretching;treadmill;walking, Time (Minutes): > 60, Frequency (Times/Week): 3, Weekly Exercise (Minutes/Week): 0, Intensity: Mild, Exercise limited by: None identified  Goals    . DIET - INCREASE WATER INTAKE     Recommend increasing water intake to 6-8 glasses a day.       Depression Screen PHQ 2/9 Scores 09/01/2020 08/27/2019 08/20/2018 02/14/2017 12/31/2015  PHQ - 2 Score 0 0 0 0 0  PHQ- 9 Score - - - 1 -    Fall Risk Fall Risk  09/01/2020 08/27/2019 08/27/2019 08/20/2018 06/01/2018  Falls in the past year? 0 0 0 No No  Comment - - - - Emmi Telephone Survey: data to providers prior to load  Number falls in past yr: 0 0 0 - -  Injury with Fall? 0 0 0 - -  Follow up - - - - -    Any stairs in or around the  home? Yes  If so, are there any without handrails? No  Home free of loose throw rugs in walkways, pet beds, electrical cords, etc? Yes  Adequate lighting in your home to reduce risk of falls? Yes   ASSISTIVE DEVICES UTILIZED TO PREVENT FALLS:  Life alert? No  Use of a cane, walker or w/c? No  Grab bars in the bathroom? No  Shower chair or bench in shower? Yes  Elevated toilet seat or a handicapped toilet? No   TIMED UP AND GO:  Was the test performed? Yes .  Length of time to ambulate 10 feet: 9 sec.   Gait steady and fast without use of assistive device  Cognitive Function: Declined today.        Immunizations Immunization History  Administered Date(s) Administered  . Fluad Quad(high Dose 65+) 08/27/2019  . Influenza-Unspecified 09/01/2015  . Moderna SARS-COVID-2 Vaccination 11/26/2019, 12/24/2019  . Pneumococcal Conjugate-13 03/19/2014  . Pneumococcal Polysaccharide-23 01/19/2011  . Tdap 04/27/2011    TDAP status: Up to date Flu Vaccine status: Completed at today's visit Pneumococcal vaccine status: Up to date Covid-19 vaccine status: Completed vaccines  Qualifies for Shingles Vaccine? Yes   Zostavax completed No   Shingrix Completed?: No.    Education has been provided regarding the importance of this vaccine. Patient has been advised to call insurance company to determine out of pocket expense if they have not yet received this vaccine. Advised may also receive vaccine at local pharmacy or Health Dept. Verbalized acceptance and understanding.  Screening Tests Health Maintenance  Topic Date Due  . INFLUENZA VACCINE  05/31/2020  . TETANUS/TDAP  04/26/2021  . DEXA SCAN  05/12/2021  . COVID-19 Vaccine  Completed  . PNA vac Low Risk Adult  Completed    Health Maintenance  Health Maintenance Due  Topic Date Due  . INFLUENZA VACCINE  05/31/2020    Colorectal cancer screening: No longer required.  Mammogram status: No longer required.  Bone Density status:  Completed 05/13/19. Results reflect: Bone density results: OSTEOPOROSIS. Repeat every 2 years.  Lung Cancer Screening: (Low Dose CT Chest recommended  if Age 90-80 years, 67 pack-year currently smoking OR have quit w/in 15years.) does not qualify.   Additional Screening:  Vision Screening: Recommended annual ophthalmology exams for early detection of glaucoma and other disorders of the eye. Is the patient up to date with their annual eye exam? No Who is the provider or what is the name of the office in which the patient attends annual eye exams? n/a If pt is not established with a provider, would they like to be referred to a provider to establish care? No .   Dental Screening: Recommended annual dental exams for proper oral hygiene  Community Resource Referral / Chronic Care Management: CRR required this visit?  No   CCM required this visit?  No      Plan:     I have personally reviewed and noted the following in the patient's chart:   . Medical and social history . Use of alcohol, tobacco or illicit drugs  . Current medications and supplements . Functional ability and status . Nutritional status . Physical activity . Advanced directives . List of other physicians . Hospitalizations, surgeries, and ER visits in previous 12 months . Vitals . Screenings to include cognitive, depression, and falls . Referrals and appointments  In addition, I have reviewed and discussed with patient certain preventive protocols, quality metrics, and best practice recommendations. A written personalized care plan for preventive services as well as general preventive health recommendations were provided to patient.     Alisha Dean [place], [place]   56[birth year]   Nurse Notes: None.

## 2020-09-01 ENCOUNTER — Other Ambulatory Visit: Payer: Self-pay

## 2020-09-01 ENCOUNTER — Ambulatory Visit (INDEPENDENT_AMBULATORY_CARE_PROVIDER_SITE_OTHER): Payer: Medicare Other | Admitting: Family Medicine

## 2020-09-01 ENCOUNTER — Ambulatory Visit (INDEPENDENT_AMBULATORY_CARE_PROVIDER_SITE_OTHER): Payer: Medicare Other

## 2020-09-01 VITALS — BP 136/72 | HR 57 | Temp 98.2°F | Ht 66.0 in | Wt 121.0 lb

## 2020-09-01 DIAGNOSIS — E7800 Pure hypercholesterolemia, unspecified: Secondary | ICD-10-CM

## 2020-09-01 DIAGNOSIS — M159 Polyosteoarthritis, unspecified: Secondary | ICD-10-CM | POA: Diagnosis not present

## 2020-09-01 DIAGNOSIS — Z0000 Encounter for general adult medical examination without abnormal findings: Secondary | ICD-10-CM | POA: Diagnosis not present

## 2020-09-01 DIAGNOSIS — M810 Age-related osteoporosis without current pathological fracture: Secondary | ICD-10-CM

## 2020-09-01 DIAGNOSIS — Z23 Encounter for immunization: Secondary | ICD-10-CM

## 2020-09-01 DIAGNOSIS — I499 Cardiac arrhythmia, unspecified: Secondary | ICD-10-CM | POA: Diagnosis not present

## 2020-09-01 NOTE — Patient Instructions (Signed)
Try over-the-counter Voltaren Gel for back pain.

## 2020-09-01 NOTE — Patient Instructions (Signed)
Alisha Dean , Thank you for taking time to come for your Medicare Wellness Visit. I appreciate your ongoing commitment to your health goals. Please review the following plan we discussed and let me know if I can assist you in the future.   Screening recommendations/referrals: Colonoscopy: No longer required.  Mammogram: No longer required.  Bone Density: Up to date, due 04/2021 Recommended yearly ophthalmology/optometry visit for glaucoma screening and checkup Recommended yearly dental visit for hygiene and checkup  Vaccinations: Influenza vaccine: Administered today.  Pneumococcal vaccine: Completed series Tdap vaccine: Up to date, due 03/2021 Shingles vaccine: Shingrix discussed. Please contact your pharmacy for coverage information.     Advanced directives: Please bring a copy of your POA (Power of Attorney) and/or Living Will to your next appointment.   Conditions/risks identified: Recommend increasing water intake to 6-8 8 oz glasses a day.   Next appointment: 9:40 AM today with Dr Rosanna Randy    Preventive Care 39 Years and Older, Female Preventive care refers to lifestyle choices and visits with your health care provider that can promote health and wellness. What does preventive care include?  A yearly physical exam. This is also called an annual well check.  Dental exams once or twice a year.  Routine eye exams. Ask your health care provider how often you should have your eyes checked.  Personal lifestyle choices, including:  Daily care of your teeth and gums.  Regular physical activity.  Eating a healthy diet.  Avoiding tobacco and drug use.  Limiting alcohol use.  Practicing safe sex.  Taking low-dose aspirin every day.  Taking vitamin and mineral supplements as recommended by your health care provider. What happens during an annual well check? The services and screenings done by your health care provider during your annual well check will depend on your age,  overall health, lifestyle risk factors, and family history of disease. Counseling  Your health care provider may ask you questions about your:  Alcohol use.  Tobacco use.  Drug use.  Emotional well-being.  Home and relationship well-being.  Sexual activity.  Eating habits.  History of falls.  Memory and ability to understand (cognition).  Work and work Statistician.  Reproductive health. Screening  You may have the following tests or measurements:  Height, weight, and BMI.  Blood pressure.  Lipid and cholesterol levels. These may be checked every 5 years, or more frequently if you are over 50 years old.  Skin check.  Lung cancer screening. You may have this screening every year starting at age 38 if you have a 30-pack-year history of smoking and currently smoke or have quit within the past 15 years.  Fecal occult blood test (FOBT) of the stool. You may have this test every year starting at age 46.  Flexible sigmoidoscopy or colonoscopy. You may have a sigmoidoscopy every 5 years or a colonoscopy every 10 years starting at age 85.  Hepatitis C blood test.  Hepatitis B blood test.  Sexually transmitted disease (STD) testing.  Diabetes screening. This is done by checking your blood sugar (glucose) after you have not eaten for a while (fasting). You may have this done every 1-3 years.  Bone density scan. This is done to screen for osteoporosis. You may have this done starting at age 27.  Mammogram. This may be done every 1-2 years. Talk to your health care provider about how often you should have regular mammograms. Talk with your health care provider about your test results, treatment options, and if necessary,  the need for more tests. Vaccines  Your health care provider may recommend certain vaccines, such as:  Influenza vaccine. This is recommended every year.  Tetanus, diphtheria, and acellular pertussis (Tdap, Td) vaccine. You may need a Td booster every 10  years.  Zoster vaccine. You may need this after age 66.  Pneumococcal 13-valent conjugate (PCV13) vaccine. One dose is recommended after age 8.  Pneumococcal polysaccharide (PPSV23) vaccine. One dose is recommended after age 75. Talk to your health care provider about which screenings and vaccines you need and how often you need them. This information is not intended to replace advice given to you by your health care provider. Make sure you discuss any questions you have with your health care provider. Document Released: 11/13/2015 Document Revised: 07/06/2016 Document Reviewed: 08/18/2015 Elsevier Interactive Patient Education  2017 [place] Prevention in the Home Falls can cause injuries. They can happen to people of all ages. There are many things you can do to make your home safe and to help prevent falls. What can I do on the outside of my home?  Regularly fix the edges of walkways and driveways and fix any cracks.  Remove anything that might make you trip as you walk through a door, such as a raised step or threshold.  Trim any bushes or trees on the path to your home.  Use bright outdoor lighting.  Clear any walking paths of anything that might make someone trip, such as rocks or tools.  Regularly check to see if handrails are loose or broken. Make sure that both sides of any steps have handrails.  Any raised decks and porches should have guardrails on the edges.  Have any leaves, snow, or ice cleared regularly.  Use sand or salt on walking paths during winter.  Clean up any spills in your garage right away. This includes oil or grease spills. What can I do in the bathroom?  Use night lights.  Install grab bars by the toilet and in the tub and shower. Do not use towel bars as grab bars.  Use non-skid mats or decals in the tub or shower.  If you need to sit down in the shower, use a plastic, non-slip stool.  Keep the floor dry. Clean up any water that  spills on the floor as soon as it happens.  Remove soap buildup in the tub or shower regularly.  Attach bath mats securely with double-sided non-slip rug tape.  Do not have throw rugs and other things on the floor that can make you trip. What can I do in the bedroom?  Use night lights.  Make sure that you have a light by your bed that is easy to reach.  Do not use any sheets or blankets that are too big for your bed. They should not hang down onto the floor.  Have a firm chair that has side arms. You can use this for support while you get dressed.  Do not have throw rugs and other things on the floor that can make you trip. What can I do in the kitchen?  Clean up any spills right away.  Avoid walking on wet floors.  Keep items that you use a lot in easy-to-reach places.  If you need to reach something above you, use a strong step stool that has a grab bar.  Keep electrical cords out of the way.  Do not use floor polish or wax that makes floors slippery. If you must use  wax, use non-skid floor wax.  Do not have throw rugs and other things on the floor that can make you trip. What can I do with my stairs?  Do not leave any items on the stairs.  Make sure that there are handrails on both sides of the stairs and use them. Fix handrails that are broken or loose. Make sure that handrails are as long as the stairways.  Check any carpeting to make sure that it is firmly attached to the stairs. Fix any carpet that is loose or worn.  Avoid having throw rugs at the top or bottom of the stairs. If you do have throw rugs, attach them to the floor with carpet tape.  Make sure that you have a light switch at the top of the stairs and the bottom of the stairs. If you do not have them, ask someone to add them for you. What else can I do to help prevent falls?  Wear shoes that:  Do not have high heels.  Have rubber bottoms.  Are comfortable and fit you well.  Are closed at the  toe. Do not wear sandals.  If you use a stepladder:  Make sure that it is fully opened. Do not climb a closed stepladder.  Make sure that both sides of the stepladder are locked into place.  Ask someone to hold it for you, if possible.  Clearly mark and make sure that you can see:  Any grab bars or handrails.  First and last steps.  Where the edge of each step is.  Use tools that help you move around (mobility aids) if they are needed. These include:  Canes.  Walkers.  Scooters.  Crutches.  Turn on the lights when you go into a dark area. Replace any light bulbs as soon as they burn out.  Set up your furniture so you have a clear path. Avoid moving your furniture around.  If any of your floors are uneven, fix them.  If there are any pets around you, be aware of where they are.  Review your medicines with your doctor. Some medicines can make you feel dizzy. This can increase your chance of falling. Ask your doctor what other things that you can do to help prevent falls. This information is not intended to replace advice given to you by your health care provider. Make sure you discuss any questions you have with your health care provider. Document Released: 08/13/2009 Document Revised: 03/24/2016 Document Reviewed: 11/21/2014 Elsevier Interactive Patient Education  2017 [employer].

## 2020-09-02 DIAGNOSIS — M545 Low back pain, unspecified: Secondary | ICD-10-CM | POA: Diagnosis not present

## 2020-09-08 DIAGNOSIS — M810 Age-related osteoporosis without current pathological fracture: Secondary | ICD-10-CM | POA: Diagnosis not present

## 2020-09-08 DIAGNOSIS — E7800 Pure hypercholesterolemia, unspecified: Secondary | ICD-10-CM | POA: Diagnosis not present

## 2020-09-08 DIAGNOSIS — I499 Cardiac arrhythmia, unspecified: Secondary | ICD-10-CM | POA: Diagnosis not present

## 2020-09-09 LAB — CBC WITH DIFFERENTIAL/PLATELET
Basophils Absolute: 0.1 10*3/uL (ref 0.0–0.2)
Basos: 1 %
EOS (ABSOLUTE): 0.2 10*3/uL (ref 0.0–0.4)
Eos: 4 %
Hematocrit: 43.6 % (ref 34.0–46.6)
Hemoglobin: 14.4 g/dL (ref 11.1–15.9)
Immature Grans (Abs): 0 10*3/uL (ref 0.0–0.1)
Immature Granulocytes: 0 %
Lymphocytes Absolute: 1.6 10*3/uL (ref 0.7–3.1)
Lymphs: 34 %
MCH: 31.2 pg (ref 26.6–33.0)
MCHC: 33 g/dL (ref 31.5–35.7)
MCV: 94 fL (ref 79–97)
Monocytes Absolute: 0.5 10*3/uL (ref 0.1–0.9)
Monocytes: 12 %
Neutrophils Absolute: 2.2 10*3/uL (ref 1.4–7.0)
Neutrophils: 49 %
Platelets: 316 10*3/uL (ref 150–450)
RBC: 4.62 x10E6/uL (ref 3.77–5.28)
RDW: 12.5 % (ref 11.7–15.4)
WBC: 4.5 10*3/uL (ref 3.4–10.8)

## 2020-09-09 LAB — COMPREHENSIVE METABOLIC PANEL
ALT: 17 IU/L (ref 0–32)
AST: 19 IU/L (ref 0–40)
Albumin/Globulin Ratio: 2 (ref 1.2–2.2)
Albumin: 4.8 g/dL — ABNORMAL HIGH (ref 3.6–4.6)
Alkaline Phosphatase: 99 IU/L (ref 44–121)
BUN/Creatinine Ratio: 21 (ref 12–28)
BUN: 16 mg/dL (ref 8–27)
Bilirubin Total: 0.4 mg/dL (ref 0.0–1.2)
CO2: 27 mmol/L (ref 20–29)
Calcium: 10.3 mg/dL (ref 8.7–10.3)
Chloride: 96 mmol/L (ref 96–106)
Creatinine, Ser: 0.76 mg/dL (ref 0.57–1.00)
GFR calc Af Amer: 83 mL/min/{1.73_m2} (ref >59)
GFR calc non Af Amer: 72 mL/min/{1.73_m2} (ref >59)
Globulin, Total: 2.4 g/dL (ref 1.5–4.5)
Glucose: 100 mg/dL — ABNORMAL HIGH (ref 65–99)
Potassium: 4.8 mmol/L (ref 3.5–5.2)
Sodium: 135 mmol/L (ref 134–144)
Total Protein: 7.2 g/dL (ref 6.0–8.5)

## 2020-09-09 LAB — LIPID PANEL
Chol/HDL Ratio: 1.8 ratio (ref 0.0–4.4)
Cholesterol, Total: 264 mg/dL — ABNORMAL HIGH (ref 100–199)
HDL: 143 mg/dL (ref >39)
LDL Chol Calc (NIH): 112 mg/dL — ABNORMAL HIGH (ref 0–99)
Triglycerides: 56 mg/dL (ref 0–149)
VLDL Cholesterol Cal: 9 mg/dL (ref 5–40)

## 2020-09-09 LAB — TSH: TSH: 5.87 u[IU]/mL — ABNORMAL HIGH (ref 0.450–4.500)

## 2020-10-01 DIAGNOSIS — Z23 Encounter for immunization: Secondary | ICD-10-CM | POA: Diagnosis not present

## 2020-10-21 DIAGNOSIS — R002 Palpitations: Secondary | ICD-10-CM | POA: Diagnosis not present

## 2020-10-21 DIAGNOSIS — R000 Tachycardia, unspecified: Secondary | ICD-10-CM | POA: Diagnosis not present

## 2020-10-21 DIAGNOSIS — I4891 Unspecified atrial fibrillation: Secondary | ICD-10-CM | POA: Diagnosis not present

## 2020-11-17 ENCOUNTER — Ambulatory Visit: Payer: Self-pay | Admitting: *Deleted

## 2020-11-17 NOTE — Telephone Encounter (Signed)
FYI

## 2020-11-17 NOTE — Telephone Encounter (Signed)
Patient calling with complaints of abdominal pain that occurs in the mid-lower abdomen. Patient states that pain has occurred for the past 6-7 days and denies radiation. Patient states that she has also experienced urinary frequency during this time and denies burning or fever. Patient states that she had Ciprofloxacin 250bmg available at home and started taking it twice a day on Saturday. Patient given care advice and educated on antibiotic use. Patient verbalized understanding. Patient has appointment scheduled on 11/18/20 at 2 pm.  Reason for Disposition . Urinating more frequently than usual (i.e., frequency)  Answer Assessment - Initial Assessment Questions 1. LOCATION: "Where does it hurt?"      Mid lower abdomen 2. RADIATION: "Does the pain shoot anywhere else?" (e.g., chest, back)     no 3. ONSET: "When did the pain begin?" (e.g., minutes, hours or days ago)     For the past 6-7 days 4. SUDDEN: "Gradual or sudden onset?"     gradual 5. PATTERN "Does the pain come and go, or is it constant?"    - If constant: "Is it getting better, staying the same, or worsening?"      (Note: Constant means the pain never goes away completely; most serious pain is constant and it progresses)     - If intermittent: "How long does it last?" "Do you have pain now?"     (Note: Intermittent means the pain goes away completely between bouts)     Comes and goes 6. SEVERITY: "How bad is the pain?"  (e.g., Scale 1-10; mild, moderate, or severe)   - MILD (1-3): doesn't interfere with normal activities, abdomen soft and not tender to touch    - MODERATE (4-7): interferes with normal activities or awakens from sleep, tender to touch    - SEVERE (8-10): excruciating pain, doubled over, unable to do any normal activities      4 7. RECURRENT SYMPTOM: "Have you ever had this type of stomach pain before?" If Yes, ask: "When was the last time?" and "What happened that time?"      Has occurred multiple times since  April 8. CAUSE: "What do you think is causing the stomach pain?"     Thinks that she may have a UTI 9. RELIEVING/AGGRAVATING FACTORS: "What makes it better or worse?" (e.g., movement, antacids, bowel movement)     no 10. OTHER SYMPTOMS: "Has there been any vomiting, diarrhea, constipation, or urine problems?"      Urinary frequency, denies burning or change in color or smell 11. PREGNANCY: "Is there any chance you are pregnant?" "When was your last menstrual period?"       n/a  Answer Assessment - Initial Assessment Questions 1. SYMPTOM: "What's the main symptom you're concerned about?" (e.g., frequency, incontinence)     Urinary frequency 2. ONSET: "When did the  Abdominal pain/urinary frequency  start?"     6-7 days 3. PAIN: "Is there any pain?" If Yes, ask: "How bad is it?" (Scale: 1-10; mild, moderate, severe)     Yes pain comes and goes, rates 4-5 4. CAUSE: "What do you think is causing the symptoms?"    Urinary tract infection 5. OTHER SYMPTOMS: "Do you have any other symptoms?" (e.g., fever, flank pain, blood in urine, pain with urination)     no 6. PREGNANCY: "Is there any chance you are pregnant?" "When was your last menstrual period?"     n/a  Protocols used: URINARY SYMPTOMS-A-AH, ABDOMINAL PAIN - [facility]

## 2020-11-18 ENCOUNTER — Ambulatory Visit: Payer: Medicare Other | Admitting: Family Medicine

## 2020-11-18 IMAGING — MG DIGITAL SCREENING UNILAT LEFT W/ TOMO W/ CAD
6 series · 6 of 18 positions shown · non-contrast
Comparison: Previous exam(s).

CLINICAL DATA: Screening.

EXAM:
DIGITAL SCREENING UNILATERAL LEFT MAMMOGRAM WITH CAD AND TOMO

[L CC synth-2D]
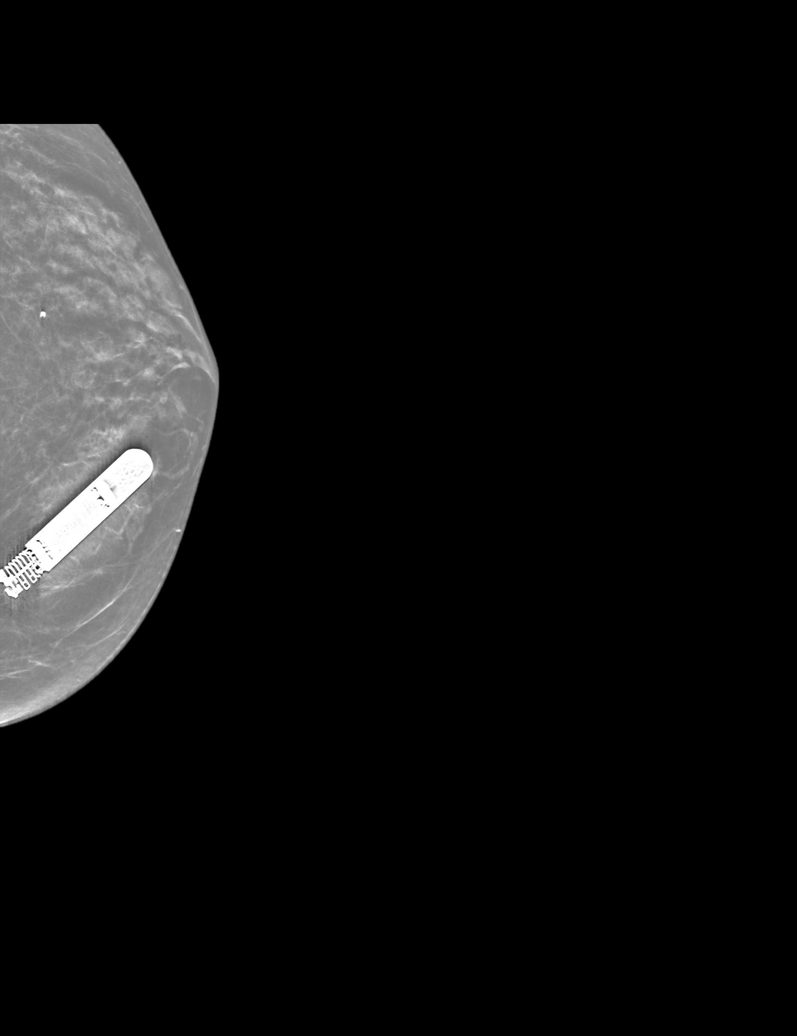

[L MLO synth-2D (1 of 2)]
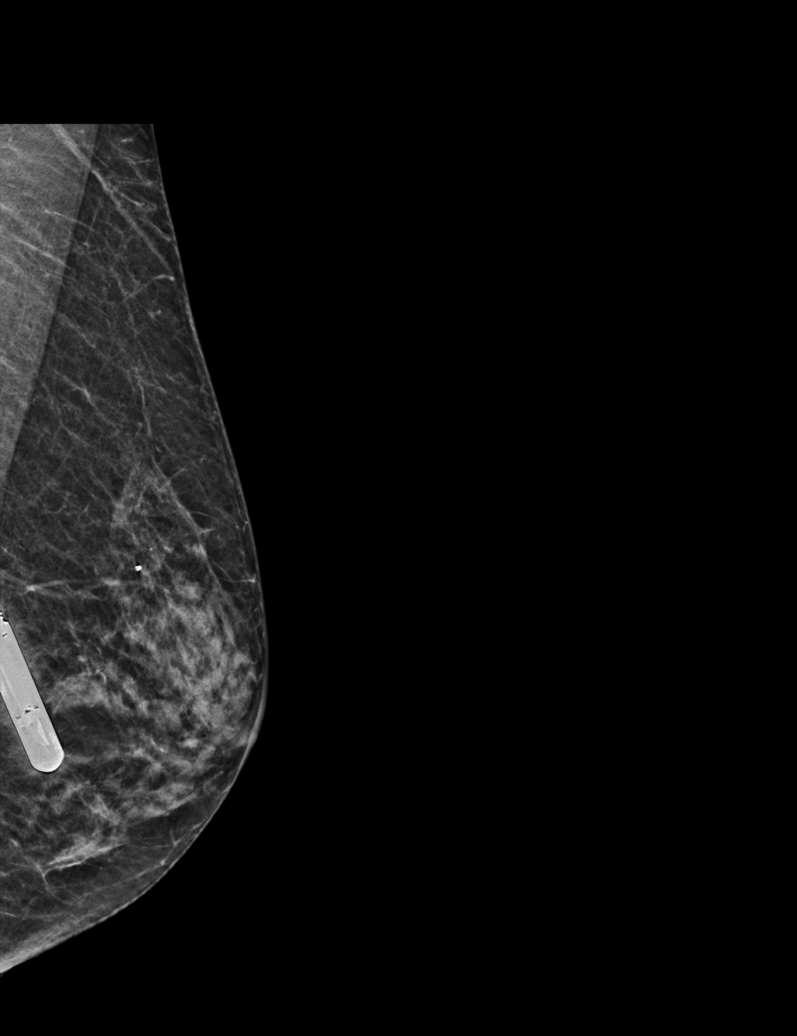

[L MLO synth-2D (2 of 2)]
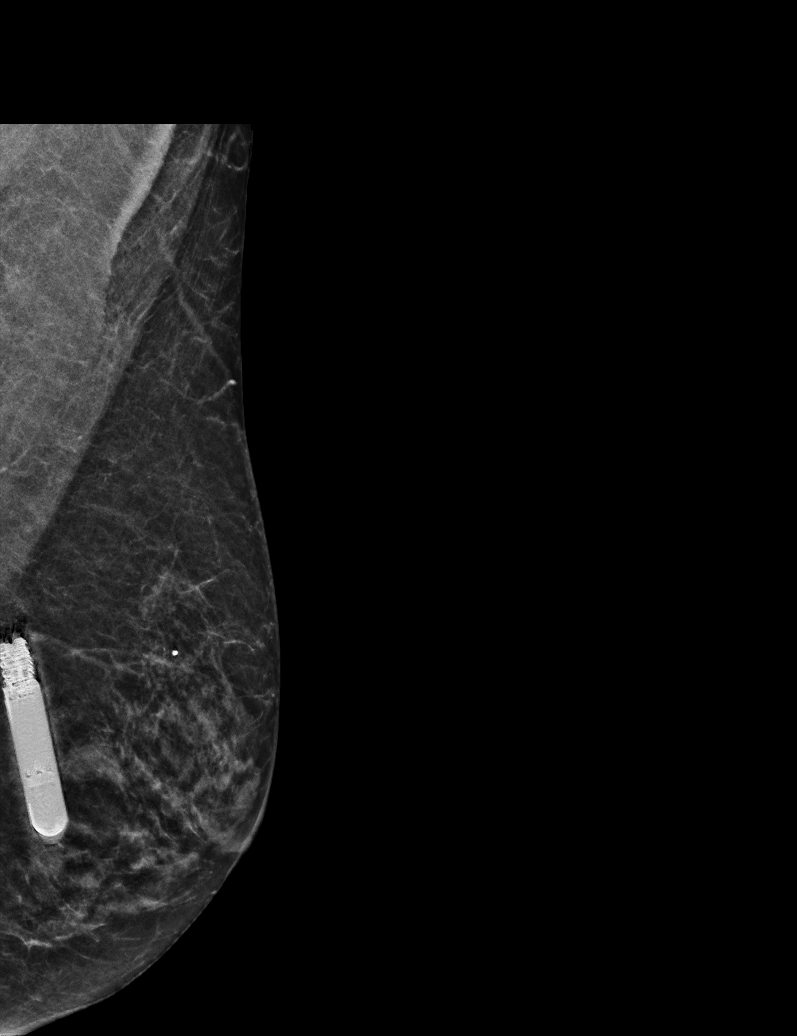

[L MLO tomo (1 of 2) · tomo slice 19/37.0]
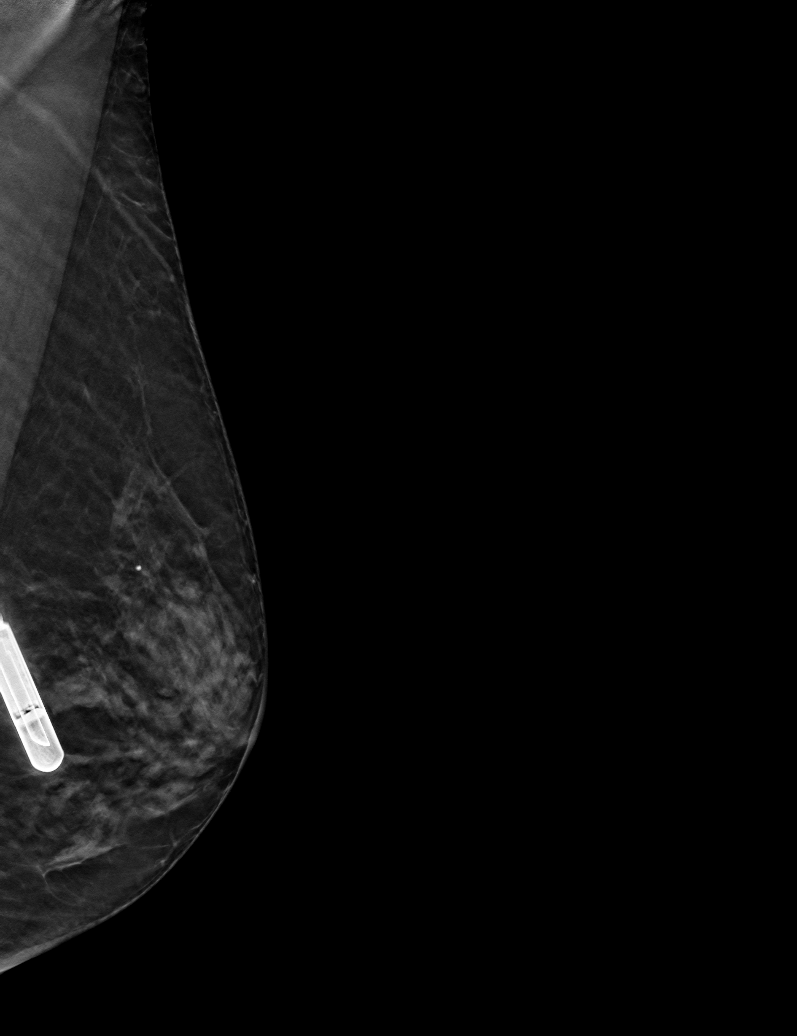

[L MLO tomo (2 of 2) · tomo slice 20/39.0]
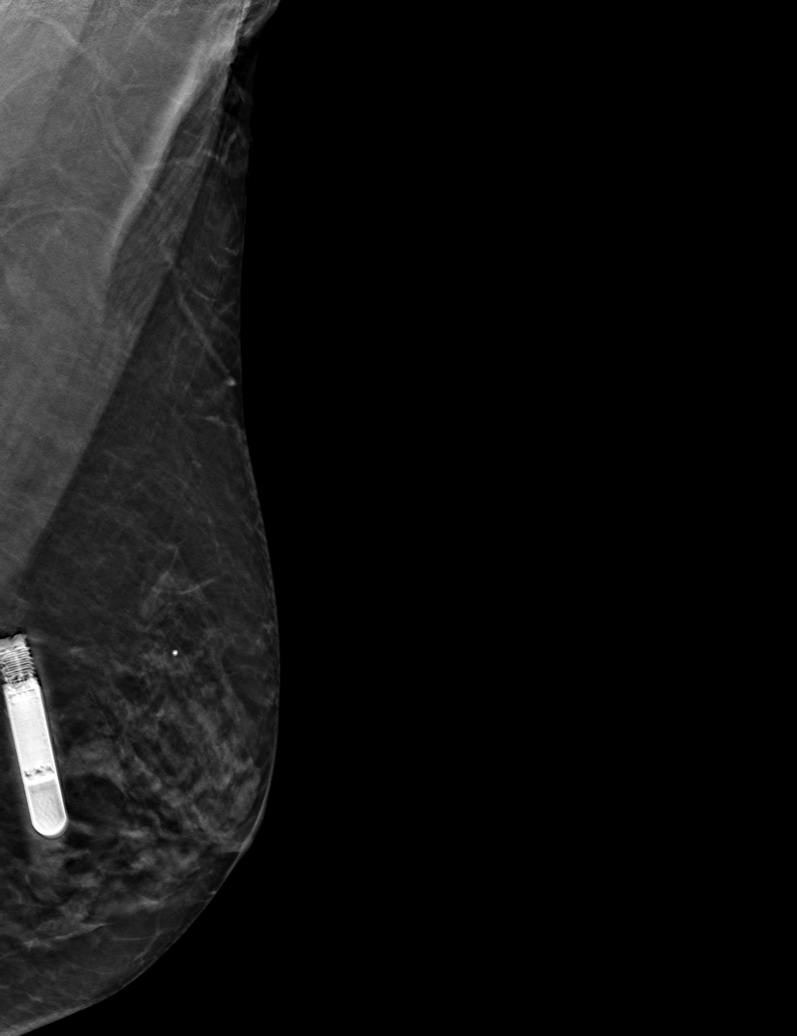

[L CC tomo · tomo slice 21/41.0]
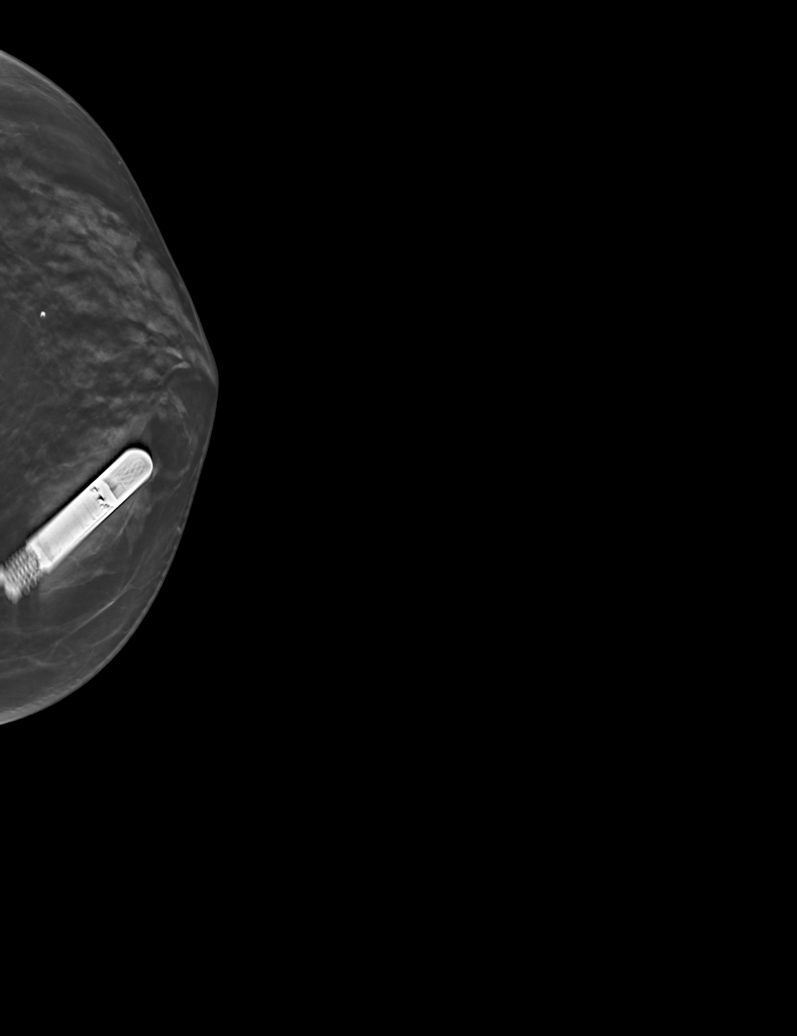

[6 of 18 positions shown; findings below may reference images not displayed]

ACR Breast Density Category c: The breast tissue is heterogeneously
dense, which may obscure small masses.
FINDINGS: The patient has had a right mastectomy. There are no findings
suspicious for malignancy. Loop recorder overlies the inner left
breast as before.

Images were processed with CAD.
IMPRESSION: No mammographic evidence of malignancy. A result letter of this
screening mammogram will be mailed directly to the patient.

RECOMMENDATION:
Screening mammogram in one year.  (Code:E8-4-7X8)

BI-RADS CATEGORY  1: Negative.

## 2020-11-18 NOTE — Telephone Encounter (Signed)
Patient canceled appointment 

## 2020-12-06 DIAGNOSIS — N390 Urinary tract infection, site not specified: Secondary | ICD-10-CM | POA: Diagnosis not present

## 2020-12-06 DIAGNOSIS — R350 Frequency of micturition: Secondary | ICD-10-CM | POA: Diagnosis not present

## 2020-12-24 DIAGNOSIS — Z1211 Encounter for screening for malignant neoplasm of colon: Secondary | ICD-10-CM | POA: Diagnosis not present

## 2020-12-24 DIAGNOSIS — R1084 Generalized abdominal pain: Secondary | ICD-10-CM | POA: Diagnosis not present

## 2020-12-24 DIAGNOSIS — R194 Change in bowel habit: Secondary | ICD-10-CM | POA: Diagnosis not present

## 2020-12-31 DIAGNOSIS — Z1211 Encounter for screening for malignant neoplasm of colon: Secondary | ICD-10-CM | POA: Diagnosis not present

## 2021-01-09 LAB — COLOGUARD: COLOGUARD: NEGATIVE

## 2021-02-01 DIAGNOSIS — I4891 Unspecified atrial fibrillation: Secondary | ICD-10-CM | POA: Diagnosis not present

## 2021-02-24 DIAGNOSIS — I4891 Unspecified atrial fibrillation: Secondary | ICD-10-CM | POA: Diagnosis not present

## 2021-02-24 DIAGNOSIS — R000 Tachycardia, unspecified: Secondary | ICD-10-CM | POA: Diagnosis not present

## 2021-02-24 DIAGNOSIS — R002 Palpitations: Secondary | ICD-10-CM | POA: Diagnosis not present

## 2021-03-26 DIAGNOSIS — I4891 Unspecified atrial fibrillation: Secondary | ICD-10-CM | POA: Diagnosis not present

## 2021-04-22 DIAGNOSIS — H2513 Age-related nuclear cataract, bilateral: Secondary | ICD-10-CM | POA: Diagnosis not present

## 2021-04-22 DIAGNOSIS — Z01818 Encounter for other preprocedural examination: Secondary | ICD-10-CM | POA: Diagnosis not present

## 2021-04-29 ENCOUNTER — Ambulatory Visit
Admission: EM | Admit: 2021-04-29 | Discharge: 2021-04-29 | Disposition: A | Discharge status: Home / Self Care | Payer: Medicare Other | Attending: Sports Medicine | Admitting: Sports Medicine

## 2021-04-29 ENCOUNTER — Encounter: Payer: Self-pay | Admitting: Emergency Medicine

## 2021-04-29 ENCOUNTER — Other Ambulatory Visit: Payer: Self-pay

## 2021-04-29 DIAGNOSIS — R059 Cough, unspecified: Secondary | ICD-10-CM

## 2021-04-29 DIAGNOSIS — R5383 Other fatigue: Secondary | ICD-10-CM

## 2021-04-29 NOTE — ED Provider Notes (Signed)
[facility]    CSN: [id number] Arrival date & time: 04/29/21  1818      History   Chief Complaint Chief Complaint  Patient presents with   Cough    HPI NIKITIA ASBILL is a 87 y.o. female.   Patient is an 87 year old female who presents for evaluation of 1 week of cough and associated fatigue.  No fever shakes chills.  No nausea vomiting diarrhea.  Normally sees Dr. Rosanna Randy at [facility].  Patient has been vaccinated against COVID x2 and has received the booster.  She is also received her flu shot.  She denies any URI symptoms including nasal congestion sore throat chest pain or shortness of breath.  No history of asthma.  No wheezing.  No history of seasonal allergies.  She does have a history of atrial fibrillation and is followed by cardiology.  Otherwise she is generally healthy.  She is on diltiazem for atrial fibrillation.  Seems to be very well rate controlled with a heart rate of 65 on arrival to the clinic tonight.  No red flag signs or symptoms elicited on history.   Past Medical History:  Diagnosis Date   Arrhythmia    tachycardia - controlled by diltiazem   Arthritis    wrists, hands   Atrial fibrillation ([place]) 2012    1 episode during pnuemonia   Carpal tunnel syndrome of left wrist    Carpal tunnel syndrome on left    Osteoporosis     Patient Active Problem List   Diagnosis Date Noted   Arthritis, degenerative 12/31/2015   Osteoporosis 12/31/2015   Fast heart beat 12/31/2015   Asymptomatic varicose veins 12/31/2015   Arrhythmia     Past Surgical History:  Procedure Laterality Date   APPENDECTOMY     CARPAL TUNNEL RELEASE Left 09/05/2018   Procedure: CARPAL TUNNEL RELEASE ENDOSCOPIC;  Surgeon: Corky Mull, MD;  Location: [place];  Service: Orthopedics;  Laterality: Left;   icm implant     LEEP  [place] N/A 12/29/2017   Procedure: LOOP RECORDER INSERTION;  Surgeon: Isaias Cowman,  MD;  Location: [place] [facility];  Service: Cardiovascular;  Laterality: N/A;   MASTECTOMY Right    right masectomy      OB History   No obstetric history on file.      Home Medications    Prior to Admission medications   Medication Sig Start Date End Date Taking? Authorizing Provider  acetaminophen (TYLENOL) 325 MG tablet Take 650 mg by mouth every 6 (six) hours as needed for moderate pain or headache.   Yes [provider]  aspirin EC 81 MG tablet Take 81 mg by mouth daily.    Yes [provider]  calcium citrate-vitamin D (CITRACAL+D) 315-200 MG-UNIT tablet Take 1 tablet by mouth daily. 600 mg daily   Yes [provider]  diltiazem (CARDIZEM CD) 120 MG 24 hr capsule Take 120 mg by mouth daily.  01/26/18 09/01/20  [provider]  Polyethylene Glycol 3350 (PEG 3350) 17 GM/SCOOP POWD Take by mouth daily.  Patient not taking: No sig reported    [provider]    Family History Family History  Problem Relation Age of Onset   Dementia Mother    Depression Mother    Migraines Mother    Coronary artery disease Father    Diabetes Mellitus I Father    Prostate cancer Father    Crohn's disease Sister  Dementia Sister    Dementia Brother    Heart disease Brother    Bipolar disorder Brother     Social History Social History   Tobacco Use   Smoking status: Former    Packs/day: 0.25    Years: 11.00    Pack years: 2.75    Types: Cigarettes    Quit date: 1969    Years since quitting: 53.5   Smokeless tobacco: Never  Vaping Use   Vaping Use: Never used  Substance Use Topics   Alcohol use: Yes    Alcohol/week: 7.0 standard drinks    Types: 7 Glasses of wine per week    Comment: 1 glass of wine nightly   Drug use: No     Allergies   Patient has no known allergies.   Review of Systems Review of Systems  Constitutional:  Positive for fatigue. Negative for activity change, appetite change, chills, diaphoresis and fever.   HENT:  Negative for congestion, ear pain, postnasal drip, rhinorrhea, sinus pressure, sinus pain, sneezing and sore throat.   Eyes:  Negative for pain.  Respiratory:  Positive for cough. Negative for chest tightness, shortness of breath and wheezing.   Cardiovascular:  Negative for chest pain and palpitations.  Gastrointestinal:  Negative for abdominal pain, diarrhea, nausea and vomiting.  Genitourinary:  Negative for dysuria.  Musculoskeletal:  Negative for back pain, myalgias and neck pain.  Skin:  Negative for color change, pallor, rash and wound.  Neurological:  Negative for dizziness, light-headedness, numbness and headaches.  All other systems reviewed and are negative.   Physical Exam Triage Vital Signs ED Triage Vitals  Enc Vitals Group     BP 04/29/21 1916 (!) 145/61     Pulse Rate 04/29/21 1916 65     Resp 04/29/21 1916 18     Temp 04/29/21 1916 98.2 F (36.8 C)     Temp Source 04/29/21 1916 Oral     SpO2 04/29/21 1916 100 %     Weight 04/29/21 1917 121 lb 0.5 oz (54.9 kg)     Height 04/29/21 1917 5\' 6"  (1.676 m)     Head Circumference --      Peak Flow --      Pain Score 04/29/21 1916 0     Pain Loc --      Pain Edu? --      Excl. in [place]? --    No data found.  Updated Vital Signs BP (!) 145/61 (BP Location: Left Arm)   Pulse 65   Temp 98.2 F (36.8 C) (Oral)   Resp 18   Ht 5\' 6"  (1.676 m)   Wt 54.9 kg   SpO2 100%   BMI 19.54 kg/m   Visual Acuity Right Eye Distance:   Left Eye Distance:   Bilateral Distance:    Right Eye Near:   Left Eye Near:    Bilateral Near:     Physical Exam Vitals and nursing note reviewed.  Constitutional:      General: She is not in acute distress.    Appearance: Normal appearance. She is not ill-appearing, toxic-appearing or diaphoretic.  HENT:     Head: Normocephalic and atraumatic.     Nose: Nose normal. No congestion or rhinorrhea.     Mouth/Throat:     Mouth: Mucous membranes are moist.     Pharynx: No  oropharyngeal exudate or posterior oropharyngeal erythema.  Eyes:     General: No scleral icterus.       Right  eye: No discharge.        Left eye: No discharge.     Extraocular Movements: Extraocular movements intact.     Conjunctiva/sclera: Conjunctivae normal.     Pupils: Pupils are equal, round, and reactive to light.  Cardiovascular:     Rate and Rhythm: Normal rate and regular rhythm.     Pulses: Normal pulses.     Heart sounds: Normal heart sounds. No murmur heard.   No friction rub. No gallop.  Pulmonary:     Effort: Pulmonary effort is normal. No respiratory distress.     Breath sounds: Normal breath sounds. No stridor. No wheezing, rhonchi or rales.  Musculoskeletal:     Cervical back: Normal range of motion and neck supple.  Skin:    General: Skin is warm and dry.     Capillary Refill: Capillary refill takes less than 2 seconds.     Coloration: Skin is not jaundiced.     Findings: No rash.  Neurological:     General: No focal deficit present.     Mental Status: She is alert and oriented to person, place, and time.     UC Treatments / Results  Labs (all labs ordered are listed, but only abnormal results are displayed) Labs Reviewed - No data to display  EKG   Radiology No results found.  Procedures Procedures (including critical care time)  Medications Ordered in UC Medications - No data to display  Initial Impression / Assessment and Plan / UC Course  I have reviewed the triage vital signs and the nursing notes.  Pertinent labs & imaging results that were available during my care of the patient were reviewed by me and considered in my medical decision making (see chart for details).  Clinical impression: Cough x1 week with associated fatigue.  Examination and vital signs are very reassuring. History of atrial fibrillation  Treatment plan: 1.  The findings and treatment plan were discussed in detail with the patient.  Patient was in agreement. 2.  I  discussed that her vital signs as well as her physical exam without any wheeze I do not believe she needs advanced imaging and she does not have a pneumonia.  In addition I do believe that without fever or significant symptoms that we need to test her for COVID or influenza. 3.  Advised her to pick up some generic guaifenesin over-the-counter to help thin secretions. 4.  Educational handouts provided. 5.  Over-the-counter meds such as Tylenol or Motrin for any fever or discomfort. 6.  She can use over-the-counter Delsym or Robitussin.  I did not want to prescribe a prescription strength cough medicine as I did not want to suppress her cough and cause her to get a pneumonia.  In addition with her having atrial fibrillation I did not want to impact that negatively in any way. 7.  If symptoms persist of asked her to see her primary care provider. 8.  If symptoms worsen then she should go to the ER. 9.  She was discharged in stable condition and will follow-up here as needed.    Final Clinical Impressions(s) / UC Diagnoses   Final diagnoses:  Cough  Fatigue, unspecified type     Discharge Instructions      As we discussed, your vital signs are very reassuring, your 100% on room air, there is no wheeze and you do not have a pneumonia clinically. I recommend over-the-counter cough medicine such as Delsym or Robitussin with supportive care including Tylenol  or Motrin for any fever or discomfort. He can pick up generic guaifenesin and can take 600 mg twice a day to help thin secretions and cough some of the secretions up. If your symptoms persist please see Dr. Rosanna Randy If they were to worsen in any way then please go to the ER. Please see educational handouts.     ED Prescriptions   None    PDMP not reviewed this encounter.   Verda Cumins, MD 04/29/21 [place]

## 2021-04-29 NOTE — Discharge Instructions (Addendum)
As we discussed, your vital signs are very reassuring, your 100% on room air, there is no wheeze and you do not have a pneumonia clinically. I recommend over-the-counter cough medicine such as Delsym or Robitussin with supportive care including Tylenol or Motrin for any fever or discomfort. He can pick up generic guaifenesin and can take 600 mg twice a day to help thin secretions and cough some of the secretions up. If your symptoms persist please see Dr. Rosanna Randy If they were to worsen in any way then please go to the ER. Please see educational handouts.

## 2021-04-29 NOTE — ED Triage Notes (Signed)
Pt c/o cough, and fatigue. Started about a week ago. Denies fever. Husband states she started out like this in the past and had pneumonia. She has been taking her husbands azithromycin.

## 2021-05-18 DIAGNOSIS — H2512 Age-related nuclear cataract, left eye: Secondary | ICD-10-CM | POA: Diagnosis not present

## 2021-05-24 ENCOUNTER — Ambulatory Visit (INDEPENDENT_AMBULATORY_CARE_PROVIDER_SITE_OTHER): Payer: Medicare Other | Admitting: Family Medicine

## 2021-05-24 ENCOUNTER — Other Ambulatory Visit: Payer: Self-pay

## 2021-05-24 ENCOUNTER — Ambulatory Visit
Admission: RE | Admit: 2021-05-24 | Discharge: 2021-05-24 | Disposition: A | Discharge status: Home / Self Care | Payer: Medicare Other | Source: Ambulatory Visit | Attending: Family Medicine | Admitting: Family Medicine

## 2021-05-24 ENCOUNTER — Ambulatory Visit
Admission: RE | Admit: 2021-05-24 | Discharge: 2021-05-24 | Disposition: A | Discharge status: Home / Self Care | Payer: Medicare Other | Attending: Family Medicine | Admitting: Family Medicine

## 2021-05-24 DIAGNOSIS — J449 Chronic obstructive pulmonary disease, unspecified: Secondary | ICD-10-CM | POA: Diagnosis not present

## 2021-05-24 DIAGNOSIS — R5383 Other fatigue: Secondary | ICD-10-CM | POA: Diagnosis not present

## 2021-05-24 DIAGNOSIS — R059 Cough, unspecified: Secondary | ICD-10-CM | POA: Diagnosis not present

## 2021-05-24 MED ORDER — DOXYCYCLINE HYCLATE 100 MG PO TABS: 100.0000 mg | ORAL_TABLET | Freq: Two times a day (BID) | ORAL | 1 refills | Status: DC

## 2021-05-24 NOTE — Progress Notes (Signed)
Virtual telephone visit    Virtual Visit via Telephone Note   This visit type was conducted due to national recommendations for restrictions regarding the COVID-19 Pandemic (e.g. social distancing) in an effort to limit this patient's exposure and mitigate transmission in our community. Due to her co-morbid illnesses, this patient is at least at moderate risk for complications without adequate follow up. This format is felt to be most appropriate for this patient at this time. The patient did not have access to video technology or had technical difficulties with video requiring transitioning to audio format only (telephone). Physical exam was limited to content and character of the telephone converstion.   Patient location: [facility] location: Personal office.  I discussed the limitations of evaluation and management by telemedicine and the availability of in person appointments. The patient expressed understanding and agreed to proceed.  This is a phone call as the patient had 6 weeks of cough and it was determined that she could have COVID and we have no PPE available for use for this visit. Visit Date: 05/24/2021  Today's healthcare provider: [name], MD   No chief complaint on file.  Subjective    HPI  Patient has 6 weeks of cough with myalgias and fatigue.  She has had no dyspnea but was seen in the urgent care in the first week and was treated with conservative over-the-counter therapy.  No COVID test no known COVID exposure and no mopped assist.  No chest x-ray. Cough is persistent and she has myalgias headache      Medications: Outpatient Medications Prior to Visit  Medication Sig   acetaminophen (TYLENOL) 325 MG tablet Take 650 mg by mouth every 6 (six) hours as needed for moderate pain or headache.   aspirin EC 81 MG tablet Take 81 mg by mouth daily.    calcium citrate-vitamin D (CITRACAL+D) 315-200 MG-UNIT tablet Take 1 tablet by mouth daily.  600 mg daily   diltiazem (CARDIZEM CD) 120 MG 24 hr capsule Take 120 mg by mouth daily.    Polyethylene Glycol 3350 (PEG 3350) 17 GM/SCOOP POWD Take by mouth daily.  (Patient not taking: No sig reported)   No facility-administered medications prior to visit.    Review of Systems     Objective    There were no vitals taken for this visit.      Assessment & Plan     1. Cough Check COVID PCR test and obtain chest x-ray to rule out pneumonia or mass. Treat with doxycycline 100 mg twice daily for a week and consider prednisone 20 mg daily for 5 days and albuterol MDI. - DG Chest 2 View; Future  2. Fatigue, unspecified type Follow-up lab work if she does not improve. That she has had a previous COVID infection that is just taking a while to recover from. Patient is comfortable with the plan.  No follow-ups on file.    I discussed the assessment and treatment plan with the patient. The patient was provided an opportunity to ask questions and all were answered. The patient agreed with the plan and demonstrated an understanding of the instructions.   The patient was advised to call back or seek an in-person evaluation if the symptoms worsen or if the condition fails to improve as anticipated.  Iprovided 10  Minutes of non-face-to-face time during this encounter.  I, [name], MD, have reviewed all documentation for this visit. The documentation on 05/26/21 for the exam, diagnosis,  procedures, and orders are all accurate and complete.   Richard Cranford Mon, MD [facility] [phone] (phone) [phone] (fax)  [place]

## 2021-05-25 ENCOUNTER — Telehealth: Payer: Self-pay | Admitting: Family Medicine

## 2021-05-25 ENCOUNTER — Telehealth: Payer: Self-pay

## 2021-05-25 DIAGNOSIS — R062 Wheezing: Secondary | ICD-10-CM

## 2021-05-25 DIAGNOSIS — R059 Cough, unspecified: Secondary | ICD-10-CM

## 2021-05-25 NOTE — Telephone Encounter (Signed)
Copied from [place] [id number]. Topic: General - Other >> May 25, 2021 10:10 AM Holley Dexter N wrote: Reason for CRM: Pt called in stating she had a missed call from the office on 07/25 around 5 o'clock  and was returning the call, but no one knew why they call, pt request a call back. Please advise.

## 2021-05-25 NOTE — Telephone Encounter (Signed)
Paient calling about results of chest xray . Please call back

## 2021-05-25 NOTE — Telephone Encounter (Signed)
Advised patient of results. Patient reports that she is willing to try inhaler. What would you like to send in?

## 2021-05-25 NOTE — Telephone Encounter (Signed)
-----   Message from Jerrol Banana., MD sent at 05/25/2021  3:48 PM EDT ----- Chest x-ray shows no pneumonia or mass.  Possible mild emphysema on chest x-ray.  If cough persist will consider work-up of this with treatment with inhaler. Please advise patient.

## 2021-05-26 MED ORDER — ALBUTEROL SULFATE HFA 108 (90 BASE) MCG/ACT IN AERS: 1.0000 | INHALATION_SPRAY | RESPIRATORY_TRACT | 11 refills | Status: DC | PRN

## 2021-05-26 NOTE — Telephone Encounter (Signed)
Rx sent to pharmacy   

## 2021-05-26 NOTE — Telephone Encounter (Signed)
See previous note

## 2021-05-27 DIAGNOSIS — H18511 Endothelial corneal dystrophy, right eye: Secondary | ICD-10-CM | POA: Diagnosis not present

## 2021-05-27 DIAGNOSIS — H2511 Age-related nuclear cataract, right eye: Secondary | ICD-10-CM | POA: Diagnosis not present

## 2021-06-17 DIAGNOSIS — C44311 Basal cell carcinoma of skin of nose: Secondary | ICD-10-CM | POA: Diagnosis not present

## 2021-06-17 DIAGNOSIS — D485 Neoplasm of uncertain behavior of skin: Secondary | ICD-10-CM | POA: Diagnosis not present

## 2021-06-17 DIAGNOSIS — L570 Actinic keratosis: Secondary | ICD-10-CM | POA: Diagnosis not present

## 2021-07-12 DIAGNOSIS — Z85828 Personal history of other malignant neoplasm of skin: Secondary | ICD-10-CM | POA: Diagnosis not present

## 2021-07-12 DIAGNOSIS — C44311 Basal cell carcinoma of skin of nose: Secondary | ICD-10-CM | POA: Diagnosis not present

## 2021-08-02 DIAGNOSIS — Z23 Encounter for immunization: Secondary | ICD-10-CM | POA: Diagnosis not present

## 2021-08-09 ENCOUNTER — Other Ambulatory Visit: Payer: Self-pay | Admitting: Family Medicine

## 2021-08-09 DIAGNOSIS — Z1231 Encounter for screening mammogram for malignant neoplasm of breast: Secondary | ICD-10-CM

## 2021-08-24 DIAGNOSIS — Z961 Presence of intraocular lens: Secondary | ICD-10-CM | POA: Diagnosis not present

## 2021-09-07 ENCOUNTER — Encounter: Payer: Self-pay | Admitting: Family Medicine

## 2021-09-07 NOTE — Progress Notes (Deleted)
Annual Wellness Visit     Patient: Alisha Dean, Female    DOB: May 13, 1933, 87 y.o.   MRN: [id number] Visit Date: 09/07/2021  Today's Provider: [name], MD   No chief complaint on file.  Subjective    Alisha Dean is a 87 y.o. female who presents today for her Annual Wellness Visit. She reports consuming a {diet types:17450} diet. {Exercise:19826} She generally feels {well/fairly well/poorly:18703}. She reports sleeping {well/fairly well/poorly:18703}. She {does/does not:200015} have additional problems to discuss today.   HPI    Medications: Outpatient Medications Prior to Visit  Medication Sig  . acetaminophen (TYLENOL) 325 MG tablet Take 650 mg by mouth every 6 (six) hours as needed for moderate pain or headache.  . albuterol (VENTOLIN HFA) 108 (90 Base) MCG/ACT inhaler Inhale 1-2 puffs into the lungs every 4 (four) hours as needed for wheezing (cough).  Marland Kitchen aspirin EC 81 MG tablet Take 81 mg by mouth daily.   . calcium citrate-vitamin D (CITRACAL+D) 315-200 MG-UNIT tablet Take 1 tablet by mouth daily. 600 mg daily  . diltiazem (CARDIZEM CD) 120 MG 24 hr capsule Take 120 mg by mouth daily.   Marland Kitchen doxycycline (VIBRA-TABS) 100 MG tablet Take 1 tablet (100 mg total) by mouth 2 (two) times daily.  . Polyethylene Glycol 3350 (PEG 3350) 17 GM/SCOOP POWD Take by mouth daily.  (Patient not taking: No sig reported)   No facility-administered medications prior to visit.    No Known Allergies  Patient Care Team: Jerrol Banana., MD as PCP - General (Family Medicine) Ubaldo Glassing Javier Docker, MD as Consulting Physician (Cardiology) Anabel Bene, MD as Referring Physician (Neurology) Poggi, Marshall Cork, MD as Consulting Physician (Surgery) Melrose Nakayama, MD as Consulting Physician (Orthopedic Surgery)  Review of Systems  Constitutional: Negative.   HENT: Negative.    Eyes: Negative.   Respiratory: Negative.    Cardiovascular: Negative.    Gastrointestinal: Negative.   Endocrine: Negative.   Genitourinary: Negative.   Musculoskeletal: Negative.   Skin: Negative.   Allergic/Immunologic: Negative.   Neurological: Negative.   Hematological: Negative.   Psychiatric/Behavioral: Negative.     {Labs  Heme  Chem  Endocrine  Serology  Results Review (optional):23779}    Objective    Vitals: There were no vitals taken for this visit. {Show previous vital signs (optional):23777}  Physical Exam Constitutional:      Appearance: Normal appearance. She is normal weight.  HENT:     Head: Normocephalic and atraumatic.     Right Ear: Tympanic membrane, ear canal and external ear normal.     Left Ear: Tympanic membrane, ear canal and external ear normal.     Nose: Nose normal.     Mouth/Throat:     Mouth: Mucous membranes are moist.     Pharynx: Oropharynx is clear.  Eyes:     Extraocular Movements: Extraocular movements intact.     Conjunctiva/sclera: Conjunctivae normal.     Pupils: Pupils are equal, round, and reactive to light.  Cardiovascular:     Rate and Rhythm: Normal rate and regular rhythm.     Pulses: Normal pulses.     Heart sounds: Normal heart sounds.  Pulmonary:     Effort: Pulmonary effort is normal.     Breath sounds: Normal breath sounds.  Abdominal:     General: Abdomen is flat. Bowel sounds are normal.     Palpations: Abdomen is soft.  Musculoskeletal:        General:  Normal range of motion.     Cervical back: Normal range of motion and neck supple.  Skin:    General: Skin is warm and dry.  Neurological:     General: No focal deficit present.     Mental Status: She is alert and oriented to person, place, and time. Mental status is at baseline.  Psychiatric:        Mood and Affect: Mood normal.        Behavior: Behavior normal.        Thought Content: Thought content normal.        Judgment: Judgment normal.  ***  Most recent functional status assessment: No flowsheet data found. Most  recent fall risk assessment: Fall Risk  09/01/2020  Falls in the past year? 0  Comment -  Number falls in past yr: 0  Injury with Fall? 0  Follow up -    Most recent depression screenings: PHQ 2/9 Scores 09/01/2020 08/27/2019  PHQ - 2 Score 0 0  PHQ- 9 Score - -   Most recent cognitive screening: No flowsheet data found. Most recent Audit-C alcohol use screening Alcohol Use Disorder Test (AUDIT) 09/01/2020  1. How often do you have a drink containing alcohol? 4  2. How many drinks containing alcohol do you have on a typical day when you are drinking? 0  3. How often do you have six or more drinks on one occasion? 0  AUDIT-C Score 4  4. How often during the last year have you found that you were not able to stop drinking once you had started? 0  5. How often during the last year have you failed to do what was normally expected from you because of drinking? 0  6. How often during the last year have you needed a first drink in the morning to get yourself going after a heavy drinking session? 0  7. How often during the last year have you had a feeling of guilt of remorse after drinking? 0  8. How often during the last year have you been unable to remember what happened the night before because you had been drinking? 0  9. Have you or someone else been injured as a result of your drinking? 0  10. Has a relative or friend or a doctor or another health worker been concerned about your drinking or suggested you cut down? 0  Alcohol Use Disorder Identification Test Final Score (AUDIT) 4  Alcohol Brief Interventions/Follow-up AUDIT Score <7 follow-up not indicated   A score of 3 or more in women, and 4 or more in men indicates increased risk for alcohol abuse, EXCEPT if all of the points are from question 1   No results found for any visits on 09/07/21.  Assessment & Plan     Annual wellness visit done today including the all of the following: Reviewed patient's Family Medical  History Reviewed and updated list of patient's medical providers Assessment of cognitive impairment was done Assessed patient's functional ability Established a written schedule for health screening [place] Completed and Reviewed  Exercise Activities and Dietary recommendations  Goals     . DIET - INCREASE WATER INTAKE     Recommend increasing water intake to 6-8 glasses a day.         Immunization History  Administered Date(s) Administered  . Fluad Quad(high Dose 65+) 08/27/2019, 09/01/2020  . Influenza-Unspecified 09/01/2015  . Pneumococcal Conjugate-13 03/19/2014  . Pneumococcal Polysaccharide-23 01/19/2011  . Tdap  04/27/2011    Health Maintenance  Topic Date Due  . Zoster Vaccines- Shingrix (1 of 2) Never done  . COVID-19 Vaccine (3 - Moderna risk series) 01/21/2020  . TETANUS/TDAP  04/26/2021  . DEXA SCAN  05/12/2021  . INFLUENZA VACCINE  05/31/2021  . Pneumonia Vaccine 25+ Years old  Completed  . HPV VACCINES  Aged Out     Discussed health benefits of physical activity, and encouraged her to engage in regular exercise appropriate for her age and condition.    ***  No follow-ups on file.     {provider attestation***:1}   [name], MD  [facility] [phone] (phone) [phone] (fax)  [place]

## 2021-09-15 DIAGNOSIS — I4891 Unspecified atrial fibrillation: Secondary | ICD-10-CM | POA: Diagnosis not present

## 2021-09-15 DIAGNOSIS — R002 Palpitations: Secondary | ICD-10-CM | POA: Diagnosis not present

## 2021-09-15 DIAGNOSIS — I8390 Asymptomatic varicose veins of unspecified lower extremity: Secondary | ICD-10-CM | POA: Diagnosis not present

## 2021-09-21 ENCOUNTER — Ambulatory Visit
Admission: RE | Admit: 2021-09-21 | Discharge: 2021-09-21 | Disposition: A | Discharge status: Home / Self Care | Payer: Medicare Other | Source: Ambulatory Visit | Attending: Family Medicine | Admitting: Family Medicine

## 2021-09-21 ENCOUNTER — Other Ambulatory Visit: Payer: Self-pay

## 2021-09-21 DIAGNOSIS — Z1231 Encounter for screening mammogram for malignant neoplasm of breast: Secondary | ICD-10-CM

## 2021-12-13 ENCOUNTER — Telehealth: Payer: Self-pay

## 2021-12-13 NOTE — Telephone Encounter (Signed)
Copied from [place] [id number]. Topic: General - Inquiry >> Dec 13, 2021 10:59 AM Greggory Keen D wrote: Reason for CRM: teresa with [facility] medical supply called saying she faxed an order two times last week for an order for mastectomy supplies.  She wants to know if the office received the order.  CB#  [phone]

## 2021-12-13 NOTE — Telephone Encounter (Signed)
Dr. Rosanna Randy can you please review and sign, I found document and placed it on top of your stack ( on your cart), I contacted Helene Kelp at [facility] to let her know we did receive order. KW

## 2022-03-17 ENCOUNTER — Telehealth: Payer: Self-pay | Admitting: Family Medicine

## 2022-03-17 NOTE — Telephone Encounter (Signed)
Copied from [place] [phone]. Topic: Medicare AWV >> Mar 17, 2022  2:12 PM Cher Nakai R wrote: Reason for CRM:  Left message for patient to call back and schedule Medicare Annual Wellness Visit (AWV) in office.   If unable to come into the office for AWV,  please offer to do virtually or by telephone.  Last AWV:  09/01/2020  Please schedule at anytime with [facility] Advisor.  30 minute appointment for Virtual or phone 45 minute appointment for in office or Initial virtual/phone  Any questions, please contact me at [phone]

## 2022-03-30 ENCOUNTER — Telehealth: Payer: Self-pay

## 2022-03-30 NOTE — Telephone Encounter (Signed)
Copied from [place] [id number]. Topic: General - Other >> Mar 30, 2022  9:31 AM McGill, Alisha Dean wrote: Reason for CRM: Pt is calling back in regards to her AWV; I advised per notes that the appointment may need to be rescheduled. Please call pt at [phone] or [phone]

## 2022-04-18 DIAGNOSIS — M47892 Other spondylosis, cervical region: Secondary | ICD-10-CM | POA: Diagnosis not present

## 2022-04-28 DIAGNOSIS — I4891 Unspecified atrial fibrillation: Secondary | ICD-10-CM | POA: Diagnosis not present

## 2022-04-28 DIAGNOSIS — R002 Palpitations: Secondary | ICD-10-CM | POA: Diagnosis not present

## 2022-05-09 DIAGNOSIS — M542 Cervicalgia: Secondary | ICD-10-CM | POA: Diagnosis not present

## 2022-05-09 DIAGNOSIS — R293 Abnormal posture: Secondary | ICD-10-CM | POA: Diagnosis not present

## 2022-05-09 DIAGNOSIS — M546 Pain in thoracic spine: Secondary | ICD-10-CM | POA: Diagnosis not present

## 2022-05-18 DIAGNOSIS — M47892 Other spondylosis, cervical region: Secondary | ICD-10-CM | POA: Diagnosis not present

## 2022-05-20 DIAGNOSIS — M542 Cervicalgia: Secondary | ICD-10-CM | POA: Diagnosis not present

## 2022-05-25 DIAGNOSIS — R293 Abnormal posture: Secondary | ICD-10-CM | POA: Diagnosis not present

## 2022-05-25 DIAGNOSIS — M542 Cervicalgia: Secondary | ICD-10-CM | POA: Diagnosis not present

## 2022-05-25 DIAGNOSIS — M546 Pain in thoracic spine: Secondary | ICD-10-CM | POA: Diagnosis not present

## 2022-05-30 ENCOUNTER — Telehealth: Payer: Self-pay

## 2022-05-30 NOTE — Telephone Encounter (Signed)
Copied from [place]. Topic: Appointment Scheduling - Scheduling Inquiry for Clinic >> May 30, 2022  9:42 AM Tiffany B wrote: Reason for CRM: patient would like to cancel / rsc her AWV scheduled for 11 am today 05/30/2022

## 2022-05-30 NOTE — Telephone Encounter (Signed)
Patient called in to reschedule AWV from today 07/31. Please call back

## 2022-06-01 ENCOUNTER — Telehealth: Payer: Self-pay | Admitting: Family Medicine

## 2022-06-01 NOTE — Telephone Encounter (Signed)
Copied from [place] [id number]. Topic: Medicare AWV >> Jun 01, 2022 10:33 AM Jae Dire wrote: Reason for CRM:  No answer unable to leave a message for patient to call back and schedule Medicare Annual Wellness Visit (AWV) in office.   If unable to come into the office for AWV,  please offer to do virtually or by telephone.  Last AWV: 09/01/2020  Please schedule at anytime with [facility] Advisor.  30 minute appointment for Virtual or phone 45 minute appointment for in office or Initial virtual/phone  Any questions, please contact me at [phone]

## 2022-06-03 DIAGNOSIS — M542 Cervicalgia: Secondary | ICD-10-CM | POA: Diagnosis not present

## 2022-06-03 DIAGNOSIS — R293 Abnormal posture: Secondary | ICD-10-CM | POA: Diagnosis not present

## 2022-06-03 DIAGNOSIS — M546 Pain in thoracic spine: Secondary | ICD-10-CM | POA: Diagnosis not present

## 2022-06-06 DIAGNOSIS — M47812 Spondylosis without myelopathy or radiculopathy, cervical region: Secondary | ICD-10-CM | POA: Diagnosis not present

## 2022-06-08 DIAGNOSIS — M546 Pain in thoracic spine: Secondary | ICD-10-CM | POA: Diagnosis not present

## 2022-06-08 DIAGNOSIS — R293 Abnormal posture: Secondary | ICD-10-CM | POA: Diagnosis not present

## 2022-06-08 DIAGNOSIS — M542 Cervicalgia: Secondary | ICD-10-CM | POA: Diagnosis not present

## 2022-06-21 DIAGNOSIS — M47812 Spondylosis without myelopathy or radiculopathy, cervical region: Secondary | ICD-10-CM | POA: Diagnosis not present

## 2022-07-05 ENCOUNTER — Ambulatory Visit (INDEPENDENT_AMBULATORY_CARE_PROVIDER_SITE_OTHER): Payer: Medicare Other | Admitting: Family Medicine

## 2022-07-05 ENCOUNTER — Encounter: Payer: Self-pay | Admitting: Family Medicine

## 2022-07-05 VITALS — BP 136/79 | HR 93 | Resp 16 | Wt 120.6 lb

## 2022-07-05 DIAGNOSIS — M810 Age-related osteoporosis without current pathological fracture: Secondary | ICD-10-CM | POA: Diagnosis not present

## 2022-07-05 DIAGNOSIS — I4891 Unspecified atrial fibrillation: Secondary | ICD-10-CM | POA: Diagnosis not present

## 2022-07-05 DIAGNOSIS — E039 Hypothyroidism, unspecified: Secondary | ICD-10-CM | POA: Diagnosis not present

## 2022-07-05 NOTE — Progress Notes (Signed)
    Established patient visit  I,[name],acting as a scribe for Richard Gilbert Jr, MD.,have documented all relevant documentation on the behalf of Richard Gilbert Jr, MD,as directed by  Richard Gilbert Jr, MD while in the presence of Richard Gilbert Jr, MD.    Patient: Alisha Dean   DOB: 06/04/1933   89 y.o. Female  MRN: [id number] Visit Date: 07/05/2022  Today's healthcare provider: Richard Gilbert Jr, MD   Chief Complaint  Patient presents with   Follow-up   Subjective    HPI  Patient is an 89 year old female who presents for follow up of chronic health.  She was last seen in 2021.  She has history of Osteoarthritis of multiple joints, cardiac arrhythmias, osteoporosis and hypercholesterolemia. She feels well and has no complaints.  She still lives independently with her husband.  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 Medications: Outpatient Medications Prior to Visit  Medication Sig   acetaminophen (TYLENOL) 325 MG tablet Take 650 mg by mouth every 6 (six) hours as needed for moderate pain or headache.   aspirin EC 81 MG tablet Take 81 mg by mouth daily.    diltiazem (CARDIZEM CD) 180 MG 24 hr capsule Take by mouth.   ergocalciferol (VITAMIN D2) 1.25 MG (50000 UT) capsule Take by mouth.   diltiazem (CARDIZEM CD) 120 MG 24 hr capsule Take 120 mg by mouth daily.    [DISCONTINUED] albuterol (VENTOLIN HFA) 108 (90 Base) MCG/ACT inhaler Inhale 1-2 puffs into the lungs every 4 (four) hours as needed for wheezing (cough). (Patient not taking: Reported on 07/05/2022)   [DISCONTINUED] calcium citrate-vitamin D (CITRACAL+D) 315-200 MG-UNIT tablet Take 1 tablet by mouth daily. 600 mg daily (Patient not taking: Reported on 07/05/2022)   [DISCONTINUED] diltiazem (TIAZAC) 180 MG 24 hr capsule Take by mouth. (Patient not taking: Reported on 07/05/2022)   [DISCONTINUED] doxycycline (VIBRA-TABS) 100 MG tablet Take 1 tablet (100 mg total) by mouth 2 (two) times  daily. (Patient not taking: Reported on 07/05/2022)   [DISCONTINUED] Polyethylene Glycol 3350 (PEG 3350) 17 GM/SCOOP POWD Take by mouth daily.  (Patient not taking: Reported on 09/01/2020)   [DISCONTINUED] predniSONE (STERAPRED UNI-PAK 48 TAB) 5 MG (48) TBPK tablet take 5 mg 6 day prednisone Dosepak as directed by pharmacist. (Patient not taking: Reported on 07/05/2022)   No facility-administered medications prior to visit.    Review of Systems  Last metabolic panel Lab Results  Component Value Date   GLUCOSE 88 07/05/2022   NA 140 07/05/2022   K 4.6 07/05/2022   CL 99 07/05/2022   CO2 24 07/05/2022   BUN 12 07/05/2022   CREATININE 0.82 07/05/2022   EGFR 69 07/05/2022   CALCIUM 10.2 07/05/2022   PROT 7.3 07/05/2022   ALBUMIN 4.9 (H) 07/05/2022   LABGLOB 2.4 07/05/2022   AGRATIO 2.0 07/05/2022   BILITOT 0.4 07/05/2022   ALKPHOS 115 07/05/2022   AST 20 07/05/2022   ALT 12 07/05/2022       Objective    BP 136/79 (BP Location: Left Arm, Patient Position: Sitting, Cuff Size: Normal)   Pulse 93   Resp 16   Wt 120 lb 9.6 oz (54.7 kg)   SpO2 95%   BMI 19.47 kg/m  BP Readings from Last 3 Encounters:  07/05/22 136/79  04/29/21 (!) 145/61  09/01/20 136/72   Wt Readings from Last 3 Encounters:  07/05/22 120 lb 9.6 oz (54.7 kg)  04/29/21 121 lb 0.5 oz (54.9 kg)  09/01/20 121   lb (54.9 kg)      Physical Exam Constitutional:      Appearance: Normal appearance.     Comments: She is a thin white woman who.  Appears younger than her age .  HENT:     Head: Normocephalic and atraumatic.     Right Ear: Tympanic membrane, ear canal and external ear normal.     Left Ear: Tympanic membrane, ear canal and external ear normal.     Nose: Nose normal.     Mouth/Throat:     Mouth: Mucous membranes are moist.     Pharynx: Oropharynx is clear.  Eyes:     Extraocular Movements: Extraocular movements intact.     Conjunctiva/sclera: Conjunctivae normal.     Pupils: Pupils are equal, round,  and reactive to light.  Cardiovascular:     Rate and Rhythm: Normal rate and regular rhythm.     Pulses: Normal pulses.     Heart sounds: Normal heart sounds.  Pulmonary:     Effort: Pulmonary effort is normal.     Breath sounds: Normal breath sounds.  Abdominal:     General: Abdomen is flat. Bowel sounds are normal.     Palpations: Abdomen is soft.  Musculoskeletal:        General: Normal range of motion.     Cervical back: Normal range of motion and neck supple.  Skin:    General: Skin is warm and dry.  Neurological:     General: No focal deficit present.     Mental Status: She is alert and oriented to person, place, and time. Mental status is at baseline.  Psychiatric:        Mood and Affect: Mood normal.        Behavior: Behavior normal.        Thought Content: Thought content normal.        Judgment: Judgment normal.       No results found for any visits on 07/05/22.  Assessment & Plan     1. Atrial fibrillation, unspecified type ([place]) Followed by cardiology, rate controlled. - CBC w/Diff/Platelet - Comprehensive Metabolic Panel (CMET) - TSH  2. Osteoporosis without current pathological fracture, unspecified osteoporosis type On vitamin D and calcium.  Consider repeat BMD - CBC w/Diff/Platelet - Comprehensive Metabolic Panel (CMET) - TSH  3. Hypothyroidism, unspecified type For euthyroid TSH - CBC w/Diff/Platelet - Comprehensive Metabolic Panel (CMET) - TSH   No follow-ups on file.      I, [name], MD, have reviewed all documentation for this visit. The documentation on 07/07/22 for the exam, diagnosis, procedures, and orders are all accurate and complete.    Trey Bebee Cranford Mon, MD  [facility] [phone] (phone) [phone] (fax)  [place]

## 2022-07-06 LAB — COMPREHENSIVE METABOLIC PANEL
ALT: 12 IU/L (ref 0–32)
AST: 20 IU/L (ref 0–40)
Albumin/Globulin Ratio: 2 (ref 1.2–2.2)
Albumin: 4.9 g/dL — ABNORMAL HIGH (ref 3.7–4.7)
Alkaline Phosphatase: 115 IU/L (ref 44–121)
BUN/Creatinine Ratio: 15 (ref 12–28)
BUN: 12 mg/dL (ref 8–27)
Bilirubin Total: 0.4 mg/dL (ref 0.0–1.2)
CO2: 24 mmol/L (ref 20–29)
Calcium: 10.2 mg/dL (ref 8.7–10.3)
Chloride: 99 mmol/L (ref 96–106)
Creatinine, Ser: 0.82 mg/dL (ref 0.57–1.00)
Globulin, Total: 2.4 g/dL (ref 1.5–4.5)
Glucose: 88 mg/dL (ref 70–99)
Potassium: 4.6 mmol/L (ref 3.5–5.2)
Sodium: 140 mmol/L (ref 134–144)
Total Protein: 7.3 g/dL (ref 6.0–8.5)
eGFR: 69 mL/min/{1.73_m2} (ref >59)

## 2022-07-06 LAB — CBC WITH DIFFERENTIAL/PLATELET
Basophils Absolute: 0 10*3/uL (ref 0.0–0.2)
Basos: 1 %
EOS (ABSOLUTE): 0.1 10*3/uL (ref 0.0–0.4)
Eos: 2 %
Hematocrit: 43.3 % (ref 34.0–46.6)
Hemoglobin: 15 g/dL (ref 11.1–15.9)
Immature Grans (Abs): 0 10*3/uL (ref 0.0–0.1)
Immature Granulocytes: 0 %
Lymphocytes Absolute: 1.7 10*3/uL (ref 0.7–3.1)
Lymphs: 28 %
MCH: 32.3 pg (ref 26.6–33.0)
MCHC: 34.6 g/dL (ref 31.5–35.7)
MCV: 93 fL (ref 79–97)
Monocytes Absolute: 0.6 10*3/uL (ref 0.1–0.9)
Monocytes: 9 %
Neutrophils Absolute: 3.9 10*3/uL (ref 1.4–7.0)
Neutrophils: 60 %
Platelets: 283 10*3/uL (ref 150–450)
RBC: 4.64 x10E6/uL (ref 3.77–5.28)
RDW: 11.9 % (ref 11.7–15.4)
WBC: 6.3 10*3/uL (ref 3.4–10.8)

## 2022-07-06 LAB — TSH: TSH: 4.41 u[IU]/mL (ref 0.450–4.500)

## 2022-07-12 DIAGNOSIS — M47812 Spondylosis without myelopathy or radiculopathy, cervical region: Secondary | ICD-10-CM | POA: Diagnosis not present

## 2022-08-01 ENCOUNTER — Ambulatory Visit: Payer: Medicare Other | Admitting: Family Medicine

## 2022-08-23 ENCOUNTER — Ambulatory Visit: Payer: Medicare Other | Admitting: Family Medicine

## 2022-09-07 ENCOUNTER — Other Ambulatory Visit: Payer: Self-pay | Admitting: Family Medicine

## 2022-09-07 DIAGNOSIS — Z23 Encounter for immunization: Secondary | ICD-10-CM | POA: Diagnosis not present

## 2022-09-07 DIAGNOSIS — Z1231 Encounter for screening mammogram for malignant neoplasm of breast: Secondary | ICD-10-CM

## 2022-10-04 DIAGNOSIS — I4891 Unspecified atrial fibrillation: Secondary | ICD-10-CM | POA: Diagnosis not present

## 2022-10-04 DIAGNOSIS — R002 Palpitations: Secondary | ICD-10-CM | POA: Diagnosis not present

## 2022-10-18 DIAGNOSIS — Z23 Encounter for immunization: Secondary | ICD-10-CM | POA: Diagnosis not present

## 2022-11-08 ENCOUNTER — Ambulatory Visit: Payer: Medicare Other

## 2022-12-02 DIAGNOSIS — H26491 Other secondary cataract, right eye: Secondary | ICD-10-CM | POA: Diagnosis not present

## 2022-12-02 DIAGNOSIS — H43813 Vitreous degeneration, bilateral: Secondary | ICD-10-CM | POA: Diagnosis not present

## 2022-12-02 DIAGNOSIS — H26492 Other secondary cataract, left eye: Secondary | ICD-10-CM | POA: Diagnosis not present

## 2022-12-29 ENCOUNTER — Ambulatory Visit
Admission: RE | Admit: 2022-12-29 | Discharge: 2022-12-29 | Disposition: A | Discharge status: Home / Self Care | Payer: Medicare Other | Source: Ambulatory Visit | Attending: Orthopaedic Surgery | Admitting: Orthopaedic Surgery

## 2022-12-29 DIAGNOSIS — Z1231 Encounter for screening mammogram for malignant neoplasm of breast: Secondary | ICD-10-CM

## 2023-01-20 DIAGNOSIS — I8390 Asymptomatic varicose veins of unspecified lower extremity: Secondary | ICD-10-CM | POA: Diagnosis not present

## 2023-01-20 DIAGNOSIS — R002 Palpitations: Secondary | ICD-10-CM | POA: Diagnosis not present

## 2023-01-20 DIAGNOSIS — I471 Supraventricular tachycardia, unspecified: Secondary | ICD-10-CM | POA: Diagnosis not present

## 2023-01-20 DIAGNOSIS — R000 Tachycardia, unspecified: Secondary | ICD-10-CM | POA: Diagnosis not present

## 2023-01-20 DIAGNOSIS — I4891 Unspecified atrial fibrillation: Secondary | ICD-10-CM | POA: Diagnosis not present

## 2023-01-20 DIAGNOSIS — R200 Anesthesia of skin: Secondary | ICD-10-CM | POA: Diagnosis not present

## 2023-01-20 DIAGNOSIS — R202 Paresthesia of skin: Secondary | ICD-10-CM | POA: Diagnosis not present

## 2023-02-14 DIAGNOSIS — I4891 Unspecified atrial fibrillation: Secondary | ICD-10-CM | POA: Diagnosis not present

## 2023-02-20 DIAGNOSIS — Z0000 Encounter for general adult medical examination without abnormal findings: Secondary | ICD-10-CM | POA: Diagnosis not present

## 2023-02-20 DIAGNOSIS — E559 Vitamin D deficiency, unspecified: Secondary | ICD-10-CM | POA: Diagnosis not present

## 2023-02-20 DIAGNOSIS — M810 Age-related osteoporosis without current pathological fracture: Secondary | ICD-10-CM | POA: Diagnosis not present

## 2023-02-20 DIAGNOSIS — I480 Paroxysmal atrial fibrillation: Secondary | ICD-10-CM | POA: Diagnosis not present

## 2023-03-24 DIAGNOSIS — R002 Palpitations: Secondary | ICD-10-CM | POA: Diagnosis not present

## 2023-04-03 DIAGNOSIS — I4819 Other persistent atrial fibrillation: Secondary | ICD-10-CM | POA: Diagnosis not present

## 2023-07-20 DIAGNOSIS — I4819 Other persistent atrial fibrillation: Secondary | ICD-10-CM | POA: Diagnosis not present

## 2023-07-20 DIAGNOSIS — R000 Tachycardia, unspecified: Secondary | ICD-10-CM | POA: Diagnosis not present

## 2023-08-15 DIAGNOSIS — Z23 Encounter for immunization: Secondary | ICD-10-CM | POA: Diagnosis not present

## 2023-08-16 DIAGNOSIS — I491 Atrial premature depolarization: Secondary | ICD-10-CM | POA: Diagnosis not present

## 2023-08-16 DIAGNOSIS — I4891 Unspecified atrial fibrillation: Secondary | ICD-10-CM | POA: Diagnosis not present

## 2023-08-16 DIAGNOSIS — I4819 Other persistent atrial fibrillation: Secondary | ICD-10-CM | POA: Diagnosis not present

## 2023-08-16 DIAGNOSIS — I440 Atrioventricular block, first degree: Secondary | ICD-10-CM | POA: Diagnosis not present

## 2023-08-16 DIAGNOSIS — I493 Ventricular premature depolarization: Secondary | ICD-10-CM | POA: Diagnosis not present

## 2023-08-16 DIAGNOSIS — R9431 Abnormal electrocardiogram [ECG] [EKG]: Secondary | ICD-10-CM | POA: Diagnosis not present

## 2023-08-24 DIAGNOSIS — I480 Paroxysmal atrial fibrillation: Secondary | ICD-10-CM | POA: Diagnosis not present

## 2023-08-24 DIAGNOSIS — E039 Hypothyroidism, unspecified: Secondary | ICD-10-CM | POA: Diagnosis not present

## 2023-08-24 DIAGNOSIS — G3184 Mild cognitive impairment, so stated: Secondary | ICD-10-CM | POA: Diagnosis not present

## 2023-09-25 DIAGNOSIS — H43813 Vitreous degeneration, bilateral: Secondary | ICD-10-CM | POA: Diagnosis not present

## 2023-09-25 DIAGNOSIS — H26491 Other secondary cataract, right eye: Secondary | ICD-10-CM | POA: Diagnosis not present

## 2023-09-25 DIAGNOSIS — H26492 Other secondary cataract, left eye: Secondary | ICD-10-CM | POA: Diagnosis not present

## 2023-09-25 DIAGNOSIS — H26493 Other secondary cataract, bilateral: Secondary | ICD-10-CM | POA: Diagnosis not present

## 2023-09-25 DIAGNOSIS — H1131 Conjunctival hemorrhage, right eye: Secondary | ICD-10-CM | POA: Diagnosis not present

## 2023-09-25 DIAGNOSIS — H52223 Regular astigmatism, bilateral: Secondary | ICD-10-CM | POA: Diagnosis not present

## 2023-10-04 DIAGNOSIS — I4891 Unspecified atrial fibrillation: Secondary | ICD-10-CM | POA: Diagnosis not present

## 2023-10-04 DIAGNOSIS — R600 Localized edema: Secondary | ICD-10-CM | POA: Diagnosis not present

## 2024-08-16 ENCOUNTER — Other Ambulatory Visit: Payer: Self-pay | Admitting: Orthopaedic Surgery

## 2024-08-16 DIAGNOSIS — Z1231 Encounter for screening mammogram for malignant neoplasm of breast: Secondary | ICD-10-CM

## 2024-08-30 ENCOUNTER — Encounter: Payer: Self-pay | Admitting: Hematology and Oncology

## 2024-08-30 ENCOUNTER — Other Ambulatory Visit: Payer: Self-pay | Admitting: Family Medicine

## 2024-08-30 DIAGNOSIS — Z1231 Encounter for screening mammogram for malignant neoplasm of breast: Secondary | ICD-10-CM

## 2024-09-17 ENCOUNTER — Encounter: Payer: Self-pay | Admitting: Hematology and Oncology

## 2024-09-24 ENCOUNTER — Ambulatory Visit
Admission: RE | Admit: 2024-09-24 | Discharge: 2024-09-24 | Disposition: A | Discharge status: Home / Self Care | Source: Ambulatory Visit | Attending: Family Medicine | Admitting: Family Medicine

## 2024-09-24 DIAGNOSIS — Z1231 Encounter for screening mammogram for malignant neoplasm of breast: Secondary | ICD-10-CM

## 2024-10-17 ENCOUNTER — Ambulatory Visit: Admission: RE | Admit: 2024-10-17 | Source: Ambulatory Visit

## 2024-10-17 ENCOUNTER — Encounter: Payer: Self-pay | Admitting: Hematology and Oncology

## 2024-10-17 DIAGNOSIS — Z1231 Encounter for screening mammogram for malignant neoplasm of breast: Secondary | ICD-10-CM
